# Patient Record
Sex: Female | Born: 1965 | Race: Black or African American | Hispanic: No | Marital: Single | Smoking: Former smoker
Health system: Southern US, Community
[De-identification: ages and names within clinical notes are randomized; demographics above are authoritative.]

## PROBLEM LIST (undated history)

## (undated) DIAGNOSIS — G4733 Obstructive sleep apnea (adult) (pediatric): Secondary | ICD-10-CM

## (undated) DIAGNOSIS — G40409 Other generalized epilepsy and epileptic syndromes, not intractable, without status epilepticus: Secondary | ICD-10-CM

## (undated) DIAGNOSIS — I509 Heart failure, unspecified: Secondary | ICD-10-CM

## (undated) DIAGNOSIS — D649 Anemia, unspecified: Secondary | ICD-10-CM

## (undated) DIAGNOSIS — I10 Essential (primary) hypertension: Secondary | ICD-10-CM

## (undated) DIAGNOSIS — Z8711 Personal history of peptic ulcer disease: Secondary | ICD-10-CM

## (undated) DIAGNOSIS — Z992 Dependence on renal dialysis: Secondary | ICD-10-CM

## (undated) DIAGNOSIS — J189 Pneumonia, unspecified organism: Secondary | ICD-10-CM

## (undated) DIAGNOSIS — Z8719 Personal history of other diseases of the digestive system: Secondary | ICD-10-CM

## (undated) DIAGNOSIS — K219 Gastro-esophageal reflux disease without esophagitis: Secondary | ICD-10-CM

## (undated) DIAGNOSIS — N186 End stage renal disease: Secondary | ICD-10-CM

## (undated) DIAGNOSIS — Z9989 Dependence on other enabling machines and devices: Secondary | ICD-10-CM

## (undated) DIAGNOSIS — J45909 Unspecified asthma, uncomplicated: Secondary | ICD-10-CM

## (undated) HISTORY — PX: AXILLARY VEIN - INTERNAL JUGULAR BYPASS GRAFT: SUR172

## (undated) HISTORY — PX: AV FISTULA PLACEMENT: SHX1204

---

## 1998-02-01 ENCOUNTER — Inpatient Hospital Stay (HOSPITAL_COMMUNITY): Admission: EM | Admit: 1998-02-01 | Discharge: 1998-02-01 | Payer: Self-pay | Admitting: *Deleted

## 1998-02-09 ENCOUNTER — Encounter: Admission: RE | Admit: 1998-02-09 | Discharge: 1998-02-09 | Payer: Self-pay | Admitting: Internal Medicine

## 1998-02-16 ENCOUNTER — Encounter: Admission: RE | Admit: 1998-02-16 | Discharge: 1998-02-16 | Payer: Self-pay | Admitting: Internal Medicine

## 1998-03-28 ENCOUNTER — Encounter: Admission: RE | Admit: 1998-03-28 | Discharge: 1998-03-28 | Payer: Self-pay | Admitting: Internal Medicine

## 1998-04-05 ENCOUNTER — Emergency Department (HOSPITAL_COMMUNITY): Admission: EM | Admit: 1998-04-05 | Discharge: 1998-04-05 | Payer: Self-pay | Admitting: Emergency Medicine

## 1998-04-20 ENCOUNTER — Emergency Department (HOSPITAL_COMMUNITY): Admission: EM | Admit: 1998-04-20 | Discharge: 1998-04-20 | Payer: Self-pay | Admitting: Internal Medicine

## 1998-07-12 ENCOUNTER — Encounter: Admission: RE | Admit: 1998-07-12 | Discharge: 1998-07-12 | Payer: Self-pay | Admitting: Hematology and Oncology

## 1998-07-12 ENCOUNTER — Ambulatory Visit (HOSPITAL_COMMUNITY): Admission: RE | Admit: 1998-07-12 | Discharge: 1998-07-12 | Payer: Self-pay | Admitting: Hematology and Oncology

## 1999-02-16 ENCOUNTER — Emergency Department (HOSPITAL_COMMUNITY): Admission: EM | Admit: 1999-02-16 | Discharge: 1999-02-16 | Payer: Self-pay | Admitting: Emergency Medicine

## 1999-03-24 ENCOUNTER — Encounter: Admission: RE | Admit: 1999-03-24 | Discharge: 1999-03-24 | Payer: Self-pay | Admitting: Internal Medicine

## 1999-05-10 ENCOUNTER — Encounter: Admission: RE | Admit: 1999-05-10 | Discharge: 1999-05-10 | Payer: Self-pay | Admitting: Hematology and Oncology

## 1999-05-10 ENCOUNTER — Ambulatory Visit (HOSPITAL_COMMUNITY): Admission: RE | Admit: 1999-05-10 | Discharge: 1999-05-10 | Payer: Self-pay | Admitting: Hematology and Oncology

## 1999-06-09 ENCOUNTER — Emergency Department (HOSPITAL_COMMUNITY): Admission: EM | Admit: 1999-06-09 | Discharge: 1999-06-09 | Payer: Self-pay | Admitting: Emergency Medicine

## 1999-06-09 ENCOUNTER — Encounter: Payer: Self-pay | Admitting: Emergency Medicine

## 1999-07-08 ENCOUNTER — Ambulatory Visit: Admission: RE | Admit: 1999-07-08 | Discharge: 1999-07-08 | Payer: Self-pay | Admitting: *Deleted

## 1999-07-21 ENCOUNTER — Encounter: Admission: RE | Admit: 1999-07-21 | Discharge: 1999-07-21 | Payer: Self-pay | Admitting: Internal Medicine

## 2000-02-29 ENCOUNTER — Encounter: Admission: RE | Admit: 2000-02-29 | Discharge: 2000-02-29 | Payer: Self-pay | Admitting: Internal Medicine

## 2000-08-19 ENCOUNTER — Emergency Department (HOSPITAL_COMMUNITY): Admission: EM | Admit: 2000-08-19 | Discharge: 2000-08-20 | Payer: Self-pay | Admitting: Emergency Medicine

## 2000-08-19 ENCOUNTER — Encounter: Payer: Self-pay | Admitting: Emergency Medicine

## 2000-08-21 ENCOUNTER — Encounter: Admission: RE | Admit: 2000-08-21 | Discharge: 2000-08-21 | Payer: Self-pay | Admitting: Hematology and Oncology

## 2000-09-04 ENCOUNTER — Encounter: Admission: RE | Admit: 2000-09-04 | Discharge: 2000-09-04 | Payer: Self-pay | Admitting: Internal Medicine

## 2001-07-14 ENCOUNTER — Encounter: Admission: RE | Admit: 2001-07-14 | Discharge: 2001-07-14 | Payer: Self-pay | Admitting: Internal Medicine

## 2001-07-24 ENCOUNTER — Emergency Department (HOSPITAL_COMMUNITY): Admission: EM | Admit: 2001-07-24 | Discharge: 2001-07-24 | Payer: Self-pay | Admitting: Emergency Medicine

## 2001-07-28 ENCOUNTER — Encounter: Admission: RE | Admit: 2001-07-28 | Discharge: 2001-07-28 | Payer: Self-pay

## 2001-08-10 ENCOUNTER — Ambulatory Visit (HOSPITAL_BASED_OUTPATIENT_CLINIC_OR_DEPARTMENT_OTHER): Admission: RE | Admit: 2001-08-10 | Discharge: 2001-08-10 | Payer: Self-pay | Admitting: Internal Medicine

## 2001-08-21 ENCOUNTER — Encounter: Admission: RE | Admit: 2001-08-21 | Discharge: 2001-08-21 | Payer: Self-pay | Admitting: Internal Medicine

## 2001-11-05 ENCOUNTER — Encounter: Admission: RE | Admit: 2001-11-05 | Discharge: 2001-11-05 | Payer: Self-pay | Admitting: Internal Medicine

## 2001-11-26 ENCOUNTER — Encounter: Admission: RE | Admit: 2001-11-26 | Discharge: 2001-11-26 | Payer: Self-pay | Admitting: Internal Medicine

## 2002-02-22 ENCOUNTER — Emergency Department (HOSPITAL_COMMUNITY): Admission: EM | Admit: 2002-02-22 | Discharge: 2002-02-22 | Payer: Self-pay

## 2002-02-25 ENCOUNTER — Inpatient Hospital Stay (HOSPITAL_COMMUNITY): Admission: AD | Admit: 2002-02-25 | Discharge: 2002-02-27 | Payer: Self-pay | Admitting: Internal Medicine

## 2002-02-25 ENCOUNTER — Encounter: Payer: Self-pay | Admitting: Internal Medicine

## 2002-02-25 ENCOUNTER — Encounter: Admission: RE | Admit: 2002-02-25 | Discharge: 2002-02-25 | Payer: Self-pay | Admitting: Internal Medicine

## 2002-02-26 ENCOUNTER — Encounter: Payer: Self-pay | Admitting: Internal Medicine

## 2002-02-27 ENCOUNTER — Encounter: Payer: Self-pay | Admitting: Internal Medicine

## 2002-03-13 ENCOUNTER — Encounter: Admission: RE | Admit: 2002-03-13 | Discharge: 2002-03-13 | Payer: Self-pay | Admitting: Internal Medicine

## 2002-03-16 ENCOUNTER — Encounter: Admission: RE | Admit: 2002-03-16 | Discharge: 2002-03-16 | Payer: Self-pay | Admitting: Internal Medicine

## 2002-03-27 ENCOUNTER — Encounter: Admission: RE | Admit: 2002-03-27 | Discharge: 2002-03-27 | Payer: Self-pay | Admitting: Internal Medicine

## 2002-06-29 ENCOUNTER — Emergency Department (HOSPITAL_COMMUNITY): Admission: EM | Admit: 2002-06-29 | Discharge: 2002-06-30 | Payer: Self-pay | Admitting: *Deleted

## 2002-08-24 ENCOUNTER — Encounter: Payer: Self-pay | Admitting: Emergency Medicine

## 2002-08-24 ENCOUNTER — Emergency Department (HOSPITAL_COMMUNITY): Admission: EM | Admit: 2002-08-24 | Discharge: 2002-08-24 | Payer: Self-pay | Admitting: *Deleted

## 2002-12-23 ENCOUNTER — Emergency Department (HOSPITAL_COMMUNITY): Admission: EM | Admit: 2002-12-23 | Discharge: 2002-12-23 | Payer: Self-pay | Admitting: Emergency Medicine

## 2016-10-06 ENCOUNTER — Emergency Department (HOSPITAL_COMMUNITY)
Admission: EM | Admit: 2016-10-06 | Discharge: 2016-10-06 | Disposition: A | Discharge status: Home / Self Care | Payer: Medicare Other | Attending: Emergency Medicine | Admitting: Emergency Medicine

## 2016-10-06 ENCOUNTER — Emergency Department (HOSPITAL_COMMUNITY): Payer: Medicare Other

## 2016-10-06 ENCOUNTER — Encounter (HOSPITAL_COMMUNITY): Payer: Self-pay | Admitting: Emergency Medicine

## 2016-10-06 DIAGNOSIS — R0602 Shortness of breath: Secondary | ICD-10-CM | POA: Insufficient documentation

## 2016-10-06 DIAGNOSIS — N186 End stage renal disease: Secondary | ICD-10-CM | POA: Insufficient documentation

## 2016-10-06 DIAGNOSIS — Z4931 Encounter for adequacy testing for hemodialysis: Secondary | ICD-10-CM | POA: Diagnosis present

## 2016-10-06 DIAGNOSIS — Z992 Dependence on renal dialysis: Secondary | ICD-10-CM | POA: Diagnosis not present

## 2016-10-06 HISTORY — DX: Unspecified asthma, uncomplicated: J45.909

## 2016-10-06 HISTORY — DX: Essential (primary) hypertension: I10

## 2016-10-06 LAB — CBC
HCT: 30.8 % — ABNORMAL LOW (ref 36.0–46.0)
Hemoglobin: 9.6 g/dL — ABNORMAL LOW (ref 12.0–15.0)
MCH: 27 pg (ref 26.0–34.0)
MCHC: 31.2 g/dL (ref 30.0–36.0)
MCV: 86.8 fL (ref 78.0–100.0)
Platelets: 188 10*3/uL (ref 150–400)
RBC: 3.55 MIL/uL — ABNORMAL LOW (ref 3.87–5.11)
RDW: 15.9 % — ABNORMAL HIGH (ref 11.5–15.5)
WBC: 7.9 10*3/uL (ref 4.0–10.5)

## 2016-10-06 LAB — BASIC METABOLIC PANEL
Anion gap: 15 (ref 5–15)
BUN: 63 mg/dL — ABNORMAL HIGH (ref 6–20)
CO2: 23 mmol/L (ref 22–32)
Calcium: 8.9 mg/dL (ref 8.9–10.3)
Chloride: 100 mmol/L — ABNORMAL LOW (ref 101–111)
Creatinine, Ser: 11.56 mg/dL — ABNORMAL HIGH (ref 0.44–1.00)
GFR calc Af Amer: 4 mL/min — ABNORMAL LOW (ref >60)
GFR calc non Af Amer: 3 mL/min — ABNORMAL LOW (ref >60)
Glucose, Bld: 81 mg/dL (ref 65–99)
Potassium: 4.6 mmol/L (ref 3.5–5.1)
Sodium: 138 mmol/L (ref 135–145)

## 2016-10-06 NOTE — Discharge Instructions (Signed)
Please read and follow all provided instructions.  Your diagnoses today include:  1. Shortness of breath     Tests performed today include: Vital signs. See below for your results today.   Medications prescribed:  Take as prescribed   Home care instructions:  Follow any educational materials contained in this packet.  Follow-up instructions: Please Call Dialysis Center on Monday morning at 6am  Return instructions:  Please return to the Emergency Department if you do not get better, if you get worse, or new symptoms OR  - Fever (temperature greater than 101.64F)  - Bleeding that does not stop with holding pressure to the area    -Severe pain (please note that you may be more sore the day after your accident)  - Chest Pain  - Difficulty breathing  - Severe nausea or vomiting  - Inability to tolerate food and liquids  - Passing out  - Skin becoming red around your wounds  - Change in mental status (confusion or lethargy)  - New numbness or weakness    Please return if you have any other emergent concerns.  Additional Information:  Your vital signs today were: BP 147/89 (BP Location: Left Wrist)    Pulse 82    Temp 98 F (36.7 C) (Oral)    Resp 20    Ht 5\' 3"  (1.6 m)    Wt 136.1 kg    LMP  (LMP Unknown)    SpO2 100%    BMI 53.14 kg/m  If your blood pressure (BP) was elevated above 135/85 this visit, please have this repeated by your doctor within one month. ---------------

## 2016-10-06 NOTE — ED Notes (Signed)
Pt states her train was late today and she missed dialysis. Pt is visiting her sister and is originally from [place]. Pt states she is a Tu, Th, S dialysis pt. Pt states she never misses her dialysis appts and would've gone today but like she stated ealier she missed her train. Pt does not want any labs done or really to be evaluated, just wants dialysis.

## 2016-10-06 NOTE — ED Notes (Signed)
Pt refused blood draw,   Nurse aware. 

## 2016-10-06 NOTE — ED Triage Notes (Signed)
[facility] states the pt called 9-1-1 for dialysis. EMS advised [facility] was onscene initially and called out for ALS due to SOB and wheezing. EMS administered 1 breathing treatment for wheezing.   Pt states her train was late today and she missed dialysis. Pt is visiting her sister and is originally from [place]. Pt states she is a Tu, Th, S dialysis pt. Pt states she never misses her dialysis appts and would've gone today but like she stated ealier she missed her train. Pt does not want any labs done or really to be evaluated, just wants dialysis.

## 2016-10-06 NOTE — ED Provider Notes (Signed)
[facility] Provider Note   CSN: [id number] Arrival date & time: 10/06/16  2013  History   Chief Complaint Chief Complaint  Patient presents with  . Needs Dialysis    HPI Brittany Brock is a 51 y.o. female.  HPI  51 y.o. female with a hx of ESRD on Dialysis Tues, Thurs, Sat, presents to the Emergency Department today due to missing dialysis appointment. Pt states he is visiting sister from [place] and schedule dialysis appointments for 7 session in [place]. Missed appointment today at 11:30AM due to transportation issues. Pt just wants dialysis. No CP/ABD pain. Noted mild SOB, but states it's probably due to fluid. No N/V. No fevers. No pain currently. No other symptoms noted   No past medical history on file.  There are no active problems to display for this patient.   No past surgical history on file.  OB History    No data available       Home Medications    Prior to Admission medications   Not on File    Family History No family history on file.  Social History Social History  Substance Use Topics  . Smoking status: Not on file  . Smokeless tobacco: Not on file  . Alcohol use Not on file     Allergies   Patient has no allergy information on record.   Review of Systems Review of Systems ROS reviewed and all are negative for acute change except as noted in the HPI.  Physical Exam Updated Vital Signs There were no vitals taken for this visit.  Physical Exam  Constitutional: She is oriented to person, place, and time. Vital signs are normal. She appears well-developed and well-nourished.  HENT:  Head: Normocephalic and atraumatic.  Right Ear: Hearing normal.  Left Ear: Hearing normal.  Eyes: Conjunctivae and EOM are normal. Pupils are equal, round, and reactive to light.  Neck: Normal range of motion. Neck supple.  Cardiovascular: Normal rate, regular rhythm, normal heart sounds and intact distal pulses.   Pulmonary/Chest: Effort  normal and breath sounds normal.  Abdominal: Soft. There is no tenderness.  Musculoskeletal: Normal range of motion.  Right AV Fistula noted. Good Thrill  Neurological: She is alert and oriented to person, place, and time.  Skin: Skin is warm and dry.  Psychiatric: She has a normal mood and affect. Her speech is normal and behavior is normal. Thought content normal.  Nursing note and vitals reviewed.  ED Treatments / Results  Labs (all labs ordered are listed, but only abnormal results are displayed) Labs Reviewed  CBC - Abnormal; Notable for the following:       Result Value   RBC 3.55 (*)    Hemoglobin 9.6 (*)    HCT 30.8 (*)    RDW 15.9 (*)    All other components within normal limits  BASIC METABOLIC PANEL - Abnormal; Notable for the following:    Chloride 100 (*)    BUN 63 (*)    Creatinine, Ser 11.56 (*)    GFR calc non Af Amer 3 (*)    GFR calc Af Amer 4 (*)    All other components within normal limits    EKG  EKG Interpretation None       Radiology Dg Chest 2 View  Result Date: 10/06/2016 CLINICAL DATA:  Shortness of breath.  Renal failure EXAM: CHEST  2 VIEW COMPARISON:  None. FINDINGS: There is no edema or consolidation. Heart is mildly enlarged with mild pulmonary  venous hypertension. No adenopathy. 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 Several bones appear somewhat sclerotic, likely due to chronic renal failure. IMPRESSION: Pulmonary vascular congestion without frank edema or consolidation. Bony changes consistent with chronic renal failure/secondary hyperparathyroidism. Electronically Signed   By: Bretta BangWilliam  Woodruff III M.D.   On: 10/06/2016 21:30    Procedures Procedures (including critical care time)  Medications Ordered in ED Medications - No data to display   Initial Impression / Assessment and Plan / ED Course  I have reviewed the triage vital signs and the nursing  notes.  Pertinent labs & imaging results that were available during my care of the patient were reviewed by me and considered in my medical decision making (see chart for details).  Clinical Course     Final Clinical Impressions(s) / ED Diagnoses  {I have reviewed and evaluated the relevant laboratory values.   {I have reviewed the relevant previous healthcare records.  {I obtained HPI from historian. {Patient discussed with supervising physician.  ED Course:  Assessment: Pt is a 50yF with hx ESRD on Dialysis T,R,Sat who presents requesting Dialysis. Missed appointment today. Here visiting from [place]. No CP. No fevers. Mild SOB earlier that has resolved.  On exam, pt in NAD. Nontoxic/nonseptic appearing. VSS. Afebrile. Lungs CTA. Heart RRR. Abdomen nontender soft. CBC unremarkable. BMP unremarkable with Potassium WNL.  CXR unremarkable. Dialysis closed tomorrow and pt at risk of worsening condition due to missed dialysis. Consulted Nephrology. Have pt call Dialysis center on Monday morning. Plan is to DC home with follow up to PCP. At time of discharge, Patient is in no acute distress. Vital Signs are stable. Patient is able to ambulate. Patient able to tolerate PO.   Disposition/Plan:  DC Home Additional Verbal discharge instructions given and discussed with patient.  Pt Instructed to f/u with PCP in the next week for evaluation and treatment of symptoms. Return precautions given Pt acknowledges and agrees with plan  Supervising Physician Donnetta HutchingBrian Cook, MD  Final diagnoses:  Shortness of breath    New Prescriptions New Prescriptions   No medications on file     Audry Piliyler Brezlyn Manrique, PA-C 10/06/16 2203    Donnetta HutchingBrian Cook, MD 10/07/16 [id number]

## 2016-10-17 ENCOUNTER — Emergency Department (HOSPITAL_COMMUNITY)
Admission: EM | Admit: 2016-10-17 | Discharge: 2016-10-17 | Disposition: A | Discharge status: Home / Self Care | Payer: Medicare Other | Attending: Emergency Medicine | Admitting: Emergency Medicine

## 2016-10-17 ENCOUNTER — Emergency Department (HOSPITAL_COMMUNITY): Payer: Medicare Other

## 2016-10-17 ENCOUNTER — Encounter (HOSPITAL_COMMUNITY): Payer: Self-pay | Admitting: *Deleted

## 2016-10-17 DIAGNOSIS — R0602 Shortness of breath: Secondary | ICD-10-CM | POA: Diagnosis present

## 2016-10-17 DIAGNOSIS — J069 Acute upper respiratory infection, unspecified: Secondary | ICD-10-CM | POA: Insufficient documentation

## 2016-10-17 DIAGNOSIS — I120 Hypertensive chronic kidney disease with stage 5 chronic kidney disease or end stage renal disease: Secondary | ICD-10-CM | POA: Diagnosis not present

## 2016-10-17 DIAGNOSIS — Z79899 Other long term (current) drug therapy: Secondary | ICD-10-CM | POA: Insufficient documentation

## 2016-10-17 DIAGNOSIS — Z992 Dependence on renal dialysis: Secondary | ICD-10-CM | POA: Diagnosis not present

## 2016-10-17 DIAGNOSIS — B9789 Other viral agents as the cause of diseases classified elsewhere: Secondary | ICD-10-CM

## 2016-10-17 DIAGNOSIS — Z87891 Personal history of nicotine dependence: Secondary | ICD-10-CM | POA: Diagnosis not present

## 2016-10-17 DIAGNOSIS — N186 End stage renal disease: Secondary | ICD-10-CM | POA: Insufficient documentation

## 2016-10-17 DIAGNOSIS — J45909 Unspecified asthma, uncomplicated: Secondary | ICD-10-CM | POA: Diagnosis not present

## 2016-10-17 LAB — CBC WITH DIFFERENTIAL/PLATELET
BASOS ABS: 0 10*3/uL (ref 0.0–0.1)
BASOS PCT: 0 %
Eosinophils Absolute: 0.3 10*3/uL (ref 0.0–0.7)
Eosinophils Relative: 4 %
HEMATOCRIT: 30 % — AB (ref 36.0–46.0)
HEMOGLOBIN: 9.2 g/dL — AB (ref 12.0–15.0)
LYMPHS PCT: 26 %
Lymphs Abs: 1.9 10*3/uL (ref 0.7–4.0)
MCH: 26.9 pg (ref 26.0–34.0)
MCHC: 30.7 g/dL (ref 30.0–36.0)
MCV: 87.7 fL (ref 78.0–100.0)
MONO ABS: 0.6 10*3/uL (ref 0.1–1.0)
Monocytes Relative: 9 %
NEUTROS ABS: 4.5 10*3/uL (ref 1.7–7.7)
NEUTROS PCT: 61 %
Platelets: 196 10*3/uL (ref 150–400)
RBC: 3.42 MIL/uL — ABNORMAL LOW (ref 3.87–5.11)
RDW: 16 % — AB (ref 11.5–15.5)
WBC: 7.3 10*3/uL (ref 4.0–10.5)

## 2016-10-17 LAB — BASIC METABOLIC PANEL
ANION GAP: 13 (ref 5–15)
BUN: 19 mg/dL (ref 6–20)
CALCIUM: 9.2 mg/dL (ref 8.9–10.3)
CHLORIDE: 96 mmol/L — AB (ref 101–111)
CO2: 27 mmol/L (ref 22–32)
Creatinine, Ser: 8.15 mg/dL — ABNORMAL HIGH (ref 0.44–1.00)
GFR calc non Af Amer: 5 mL/min — ABNORMAL LOW (ref >60)
GFR, EST AFRICAN AMERICAN: 6 mL/min — AB (ref >60)
GLUCOSE: 156 mg/dL — AB (ref 65–99)
POTASSIUM: 4 mmol/L (ref 3.5–5.1)
Sodium: 136 mmol/L (ref 135–145)

## 2016-10-17 LAB — I-STAT TROPONIN, ED: Troponin i, poc: 0 ng/mL (ref 0.00–0.08)

## 2016-10-17 MED ORDER — OXYMETAZOLINE HCL 0.05 % NA SOLN
2.0000 | Freq: Once | NASAL | Status: AC
  Administered 2016-10-17: 2 via NASAL
  Filled 2016-10-17: qty 15

## 2016-10-17 MED ORDER — BENZONATATE 100 MG PO CAPS: 100.0000 mg | ORAL_CAPSULE | Freq: Three times a day (TID) | ORAL | 0 refills | Status: DC | PRN

## 2016-10-17 NOTE — ED Notes (Signed)
Pt states she understands instructions. Home stable via wc with family. 

## 2016-10-17 NOTE — ED Triage Notes (Signed)
Patient states she arrived in town on SAT and is here for approx. 1 month. States she had dialysis on Monday and Tues. States she hasn't felt well since she got her c/o sob and cough. States she had her full dialysis.

## 2016-10-17 NOTE — ED Provider Notes (Signed)
TIME SEEN: 5:50 AM  CHIEF COMPLAINT: Nasal congestion, cough  HPI: Pt is a 51 y.o. female with history of hypertension, end-stage renal disease on hemodialysis Tuesday, Thursday and Saturday he was last dialyzed yesterday who presents to the emergency department stating "I don't feel well". States that she has had dry cough, nasal congestion, body aches for the past week. Has had subjective fevers and chills. Reports that she has had intermittent yellow sputum production. Family reports she complained of chest pain with coughing and that is why they brought her to the hospital. No vomiting or diarrhea. Has not missed any recent dialysis. No history of ACS, PE or DVT. Does wear oxygen at night as she has sleep apnea. States she feel short of breath because she cannot breathe through her nose.  ROS: See HPI Constitutional:  fever  Eyes: no drainage  ENT:  runny nose   Cardiovascular:  no chest pain  Resp: no SOB  GI: no vomiting GU: no dysuria Integumentary: no rash  Allergy: no hives  Musculoskeletal: no leg swelling  Neurological: no slurred speech ROS otherwise negative  PAST MEDICAL HISTORY/PAST SURGICAL HISTORY:  Past Medical History:  Diagnosis Date  . Asthma   . Hypertension   . Renal disorder   . Sleep apnea     MEDICATIONS:  Prior to Admission medications   Medication Sig Start Date End Date Taking? Authorizing Provider  albuterol (PROVENTIL HFA;VENTOLIN HFA) 108 (90 Base) MCG/ACT inhaler Inhale 2 puffs into the lungs every 6 (six) hours as needed for wheezing or shortness of breath.   Yes Historical Provider, MD  calcium acetate (PHOSLO) 667 MG capsule Take 2,668 mg by mouth 3 (three) times daily with meals.   Yes Historical Provider, MD  cinacalcet (SENSIPAR) 90 MG tablet Take 180 mg by mouth every evening.    Yes Historical Provider, MD  cloNIDine (CATAPRES) 0.1 MG tablet Take 0.1 mg by mouth 2 (two) times daily.   Yes Historical Provider, MD  Fluticasone-Salmeterol  (ADVAIR) 250-50 MCG/DOSE AEPB Inhale 1 puff into the lungs 2 (two) times daily.   Yes Historical Provider, MD  isosorbide mononitrate (IMDUR) 30 MG 24 hr tablet Take 30 mg by mouth daily.   Yes Historical Provider, MD  losartan (COZAAR) 50 MG tablet Take 50 mg by mouth 2 (two) times daily.   Yes Historical Provider, MD  metoprolol (LOPRESSOR) 50 MG tablet Take 50 mg by mouth 2 (two) times daily.   Yes Historical Provider, MD  phenytoin (DILANTIN) 100 MG ER capsule Take by mouth 3 (three) times daily.   Yes Historical Provider, MD    ALLERGIES:  Allergies  Allergen Reactions  . Penicillins Hives    SOCIAL HISTORY:  Social History  Substance Use Topics  . Smoking status: Former Smoker    Packs/day: 5.00    Years: 30.00    Types: Cigarettes    Quit date: 09/30/2016  . Smokeless tobacco: Never Used  . Alcohol use No     Comment: Social    FAMILY HISTORY: History reviewed. No pertinent family history.  EXAM: BP 158/98 (BP Location: Left Arm)   Temp 98.7 F (37.1 C) (Oral)   Resp 22   Ht 5\' 3"  (1.6 m)   Wt 285 lb 4.4 oz (129.4 kg)   LMP  (LMP Unknown)   SpO2 100%   BMI 50.53 kg/m  CONSTITUTIONAL: Alert and oriented and responds appropriately to questions. Well-appearing; well-nourished HEAD: Normocephalic EYES: Conjunctivae clear, PERRL, EOMI ENT: normal nose; no  rhinorrhea; Nasal congestion, moist mucous membranes NECK: Supple, no meningismus, no nuchal rigidity, no LAD  CARD: RRR; S1 and S2 appreciated; no murmurs, no clicks, no rubs, no gallops RESP: Normal chest excursion without splinting or tachypnea; breath sounds clear and equal bilaterally; no wheezes, no rhonchi, no rales, no hypoxia or respiratory distress, speaking full sentences ABD/GI: Normal bowel sounds; non-distended; soft, non-tender, no rebound, no guarding, no peritoneal signs, no hepatosplenomegaly BACK:  The back appears normal and is non-tender to palpation, there is no CVA tenderness EXT: Normal ROM  in all joints; non-tender to palpation; no edema; normal capillary refill; no cyanosis, no calf tenderness or swelling    SKIN: Normal color for age and race; warm; no rash NEURO: Moves all extremities equally, sensation to light touch intact diffusely, cranial nerves II through XII intact, normal speech PSYCH: The patient's mood and manner are appropriate. Grooming and personal hygiene are appropriate.  MEDICAL DECISION MAKING: Patient here with what sounds like viral upper respiratory infection. Does complain of some chest pain that has resolved likely musculoskeletal as it seems to be worse with coughing. Given she is a dialysis patient however we will obtain labs including troponin, EKG and a chest x-ray. She does not appear volume overloaded currently. Has a lot of nasal congestion which I think is what is contributing to her feeling short of breath.    ED PROGRESS: Patient's labs show chronic kidney disease. Potassium normal. No leukocytosis. She has anemia which is likely secondary to her chronic kidney disease. Troponin is negative. Chest x-ray shows cardiomegaly with vascular congestion and possible mild pulmonary edema. No infiltrate. She is satting well on her normal oxygen. Her lungs are completely clear. I do not feel this is an indication for emergent dialysis and she is scheduled for dialysis tomorrow. Again I suspect that this is a viral upper respiratory infection and most of her shortness of breath is because she feels she cannot breathe through her nose. We have provided her with Afrin and Tessalon Perles. Have recommended Mucinex over-the-counter and Tylenol at home for fever and pain. Discussed return precautions. She verbalizes understanding is comfortable with this plan.   At this time, I do not feel there is any life-threatening condition present. I have reviewed and discussed all results (EKG, imaging, lab, urine as appropriate) and exam findings with patient/family. I have  reviewed nursing notes and appropriate previous records.  I feel the patient is safe to be discharged home without further emergent workup and can continue workup as an outpatient as needed. Discussed usual and customary return precautions. Patient/family verbalize understanding and are comfortable with this plan.  Outpatient follow-up has been provided. All questions have been answered.   EKG Interpretation  Date/Time:  Wednesday October 17 2016 06:52:32 EST Ventricular Rate:  82 PR Interval:    QRS Duration: 109 QT Interval:  405 QTC Calculation: 473 R Axis:   11 Text Interpretation:  Sinus rhythm Low voltage, precordial leads No old tracing to compare Confirmed by WARD,  DO, KRISTEN [phone]([id number]) on 10/17/2016 7:14:14 AM         Layla MawKristen N Ward, DO 10/17/16 [id number]

## 2016-10-24 ENCOUNTER — Encounter (HOSPITAL_COMMUNITY): Payer: Self-pay | Admitting: Emergency Medicine

## 2016-10-24 ENCOUNTER — Emergency Department (HOSPITAL_COMMUNITY): Payer: Medicare Other

## 2016-10-24 ENCOUNTER — Emergency Department (HOSPITAL_COMMUNITY)
Admission: EM | Admit: 2016-10-24 | Discharge: 2016-10-24 | Disposition: A | Discharge status: Home / Self Care | Payer: Medicare Other | Attending: Emergency Medicine | Admitting: Emergency Medicine

## 2016-10-24 DIAGNOSIS — B9789 Other viral agents as the cause of diseases classified elsewhere: Secondary | ICD-10-CM

## 2016-10-24 DIAGNOSIS — Z87891 Personal history of nicotine dependence: Secondary | ICD-10-CM | POA: Diagnosis not present

## 2016-10-24 DIAGNOSIS — I10 Essential (primary) hypertension: Secondary | ICD-10-CM | POA: Insufficient documentation

## 2016-10-24 DIAGNOSIS — J988 Other specified respiratory disorders: Secondary | ICD-10-CM | POA: Insufficient documentation

## 2016-10-24 DIAGNOSIS — J45909 Unspecified asthma, uncomplicated: Secondary | ICD-10-CM | POA: Diagnosis not present

## 2016-10-24 DIAGNOSIS — R05 Cough: Secondary | ICD-10-CM | POA: Diagnosis present

## 2016-10-24 DIAGNOSIS — Z79899 Other long term (current) drug therapy: Secondary | ICD-10-CM | POA: Insufficient documentation

## 2016-10-24 LAB — CBC WITH DIFFERENTIAL/PLATELET
Basophils Absolute: 0 10*3/uL (ref 0.0–0.1)
Basophils Relative: 0 %
EOS ABS: 0.3 10*3/uL (ref 0.0–0.7)
EOS PCT: 3 %
HCT: 32.4 % — ABNORMAL LOW (ref 36.0–46.0)
Hemoglobin: 9.9 g/dL — ABNORMAL LOW (ref 12.0–15.0)
LYMPHS ABS: 1.7 10*3/uL (ref 0.7–4.0)
LYMPHS PCT: 20 %
MCH: 26.8 pg (ref 26.0–34.0)
MCHC: 30.6 g/dL (ref 30.0–36.0)
MCV: 87.6 fL (ref 78.0–100.0)
MONO ABS: 0.7 10*3/uL (ref 0.1–1.0)
MONOS PCT: 8 %
Neutro Abs: 5.7 10*3/uL (ref 1.7–7.7)
Neutrophils Relative %: 69 %
PLATELETS: 197 10*3/uL (ref 150–400)
RBC: 3.7 MIL/uL — ABNORMAL LOW (ref 3.87–5.11)
RDW: 16 % — ABNORMAL HIGH (ref 11.5–15.5)
WBC: 8.4 10*3/uL (ref 4.0–10.5)

## 2016-10-24 LAB — BASIC METABOLIC PANEL
Anion gap: 16 — ABNORMAL HIGH (ref 5–15)
BUN: 17 mg/dL (ref 6–20)
CO2: 28 mmol/L (ref 22–32)
CREATININE: 7.19 mg/dL — AB (ref 0.44–1.00)
Calcium: 9.4 mg/dL (ref 8.9–10.3)
Chloride: 94 mmol/L — ABNORMAL LOW (ref 101–111)
GFR calc Af Amer: 7 mL/min — ABNORMAL LOW (ref >60)
GFR, EST NON AFRICAN AMERICAN: 6 mL/min — AB (ref >60)
GLUCOSE: 103 mg/dL — AB (ref 65–99)
Potassium: 3.7 mmol/L (ref 3.5–5.1)
SODIUM: 138 mmol/L (ref 135–145)

## 2016-10-24 LAB — I-STAT TROPONIN, ED: Troponin i, poc: 0.01 ng/mL (ref 0.00–0.08)

## 2016-10-24 MED ORDER — ALBUTEROL SULFATE (2.5 MG/3ML) 0.083% IN NEBU
5.0000 mg | INHALATION_SOLUTION | Freq: Once | RESPIRATORY_TRACT | Status: AC
  Administered 2016-10-24: 5 mg via RESPIRATORY_TRACT

## 2016-10-24 MED ORDER — AZITHROMYCIN 250 MG PO TABS: ORAL_TABLET | ORAL | 0 refills | Status: DC

## 2016-10-24 MED ORDER — ALBUTEROL SULFATE (2.5 MG/3ML) 0.083% IN NEBU
INHALATION_SOLUTION | RESPIRATORY_TRACT | Status: AC
  Filled 2016-10-24: qty 3

## 2016-10-24 MED ORDER — BENZONATATE 100 MG PO CAPS: 100.0000 mg | ORAL_CAPSULE | Freq: Three times a day (TID) | ORAL | 0 refills | Status: DC | PRN

## 2016-10-24 NOTE — Discharge Instructions (Signed)
Your symptoms are likely caused by a viral respiratory infection.  However, due to the prolonged course of your sickness, please take antibiotic Zpak as prescribed to treat for potential pneumonia.  Take Tessalon as cough medication.  Continue with your dialysis schedule and follow up with your doctor for further care.

## 2016-10-24 NOTE — ED Provider Notes (Signed)
[facility] Provider Note   CSN: [id number] Arrival date & time: 10/24/16  0022     History   Chief Complaint Chief Complaint  Patient presents with  . Shortness of Breath  . Cough    HPI Brittany Brock is a 51 y.o. female.  HPI   51 year old female with history of end-stage renal disease currently on Tuesday Thursday Saturday dialysis, hypertension, asthma presenting with flulike symptoms. Patient states for more than 2 weeks she has had congestions, sneezing, coughing, pleuritic chest chest pain, generalized fatigue, subjective fever, chills, myalgias. Symptom has been persistent and this is her third ER visits for the same. She report receiving several different medications such as Mucinex, antibiotic with minimal relief. Patient denies lightheadedness, nausea, vomiting, diarrhea. Her last dialysis was yesterday. Patient is here requesting to be admitted to the hospital to get treated until the symptoms completely resolved.  Past Medical History:  Diagnosis Date  . Asthma   . Hypertension   . Renal disorder   . Sleep apnea     There are no active problems to display for this patient.   Past Surgical History:  Procedure Laterality Date  . AXILLARY VEIN - INTERNAL JUGULAR BYPASS GRAFT    . Graft      OB History    No data available       Home Medications    Prior to Admission medications   Medication Sig Start Date End Date Taking? Authorizing Provider  albuterol (PROVENTIL HFA;VENTOLIN HFA) 108 (90 Base) MCG/ACT inhaler Inhale 2 puffs into the lungs every 6 (six) hours as needed for wheezing or shortness of breath.   Yes Historical Provider, MD  benzonatate (TESSALON) 100 MG capsule Take 1 capsule (100 mg total) by mouth 3 (three) times daily as needed for cough. 10/17/16  Yes Kristen N Ward, DO  calcium acetate (PHOSLO) 667 MG capsule Take 2,668 mg by mouth 3 (three) times daily with meals.   Yes Historical Provider, MD  cinacalcet (SENSIPAR) 90 MG  tablet Take 180 mg by mouth every evening.    Yes Historical Provider, MD  cloNIDine (CATAPRES) 0.1 MG tablet Take 0.1 mg by mouth 2 (two) times daily.   Yes Historical Provider, MD  Fluticasone-Salmeterol (ADVAIR) 250-50 MCG/DOSE AEPB Inhale 1 puff into the lungs 2 (two) times daily.   Yes Historical Provider, MD  isosorbide mononitrate (IMDUR) 30 MG 24 hr tablet Take 30 mg by mouth daily.   Yes Historical Provider, MD  losartan (COZAAR) 50 MG tablet Take 50 mg by mouth 2 (two) times daily.   Yes Historical Provider, MD  metoprolol (LOPRESSOR) 50 MG tablet Take 50 mg by mouth 2 (two) times daily.   Yes Historical Provider, MD  phenytoin (DILANTIN) 100 MG ER capsule Take by mouth 3 (three) times daily.   Yes Historical Provider, MD    Family History No family history on file.  Social History Social History  Substance Use Topics  . Smoking status: Former Smoker    Packs/day: 5.00    Years: 30.00    Types: Cigarettes    Quit date: 09/30/2016  . Smokeless tobacco: Never Used  . Alcohol use No     Comment: Social     Allergies   Penicillins   Review of Systems Review of Systems  All other systems reviewed and are negative.    Physical Exam Updated Vital Signs BP 146/77 (BP Location: Left Arm)   Pulse 85   Temp 98.9 F (37.2 C) (Oral)  Resp 25   Ht 5\' 3"  (1.6 m)   Wt 129.3 kg   LMP  (LMP Unknown)   SpO2 98%   BMI 50.49 kg/m   Physical Exam  Constitutional: She is oriented to person, place, and time. She appears well-developed and well-nourished. No distress.  Moderately obese female sleeping in bed but easily arousable.  HENT:  Head: Atraumatic.  Right Ear: External ear normal.  Left Ear: External ear normal.  Nose: Nose normal.  Mouth/Throat: Oropharynx is clear and moist.  Wearing O2 supplementation  Eyes: Conjunctivae are normal.  Neck: Neck supple.  Cardiovascular: Normal rate and regular rhythm.   Pulmonary/Chest: Effort normal and breath sounds normal.  No stridor.  Decreased lung sounds without overt wheezes, rales, or rhonchi  Abdominal: Soft. There is no tenderness.  Musculoskeletal: She exhibits no edema.  Neurological: She is alert and oriented to person, place, and time.  Skin: No rash noted.  Psychiatric: She has a normal mood and affect.  Nursing note and vitals reviewed.    ED Treatments / Results  Labs (all labs ordered are listed, but only abnormal results are displayed) Labs Reviewed  CBC WITH DIFFERENTIAL/PLATELET - Abnormal; Notable for the following:       Result Value   RBC 3.70 (*)    Hemoglobin 9.9 (*)    HCT 32.4 (*)    RDW 16.0 (*)    All other components within normal limits  BASIC METABOLIC PANEL - Abnormal; Notable for the following:    Chloride 94 (*)    Glucose, Bld 103 (*)    Creatinine, Ser 7.19 (*)    GFR calc non Af Amer 6 (*)    GFR calc Af Amer 7 (*)    Anion gap 16 (*)    All other components within normal limits  I-STAT TROPOININ, ED    EKG  EKG Interpretation  Date/Time:  Wednesday October 24 2016 00:35:04 EST Ventricular Rate:  81 PR Interval:  142 QRS Duration: 90 QT Interval:  392 QTC Calculation: 455 R Axis:   -25 Text Interpretation:  Normal sinus rhythm Minimal voltage criteria for LVH, may be normal variant Borderline ECG When compared with ECG of 10/17/2016, No significant change was found Confirmed by [facility]  MD, DAVID ([id number]) on 10/24/2016 12:43:56 AM       Radiology Dg Chest 2 View  Result Date: 10/24/2016 CLINICAL DATA:  Dyspnea since Sunday EXAM: CHEST  2 VIEW COMPARISON:  10/17/2016 FINDINGS: Heart is enlarged. No aortic aneurysm. Mild interstitial edema is again noted. No effusion or pneumothorax. No suspicious osseous abnormalities. IMPRESSION: Cardiomegaly with mild interstitial pulmonary edema. Electronically Signed   By: Tollie Eth M.D.   On: 10/24/2016 01:21    Procedures Procedures (including critical care time)  Medications Ordered in ED Medications    albuterol (PROVENTIL) (2.5 MG/3ML) 0.083% nebulizer solution (not administered)  albuterol (PROVENTIL) (2.5 MG/3ML) 0.083% nebulizer solution 5 mg (5 mg Nebulization Given 10/24/16 0051)     Initial Impression / Assessment and Plan / ED Course  I have reviewed the triage vital signs and the nursing notes.  Pertinent labs & imaging results that were available during my care of the patient were reviewed by me and considered in my medical decision making (see chart for details).     BP 154/90   Pulse 80   Temp 98.9 F (37.2 C) (Oral)   Resp 18   Ht 5\' 3"  (1.6 m)   Wt 129.3 kg   LMP  (  LMP Unknown)   SpO2 100%   BMI 50.49 kg/m    Final Clinical Impressions(s) / ED Diagnoses   Final diagnoses:  Viral respiratory infection    New Prescriptions New Prescriptions   AZITHROMYCIN (ZITHROMAX Z-PAK) 250 MG TABLET    2 po day one, then 1 daily x 4 days   6:32 AM Patient with history of pulmonary impairment as well as end-stage renal disease here with flulike symptoms that has been ongoing for more than 2 weeks. Her symptoms consistence with a viral etiology. Her labs are at baseline, normal potassium level, chest x-ray without acute focal infiltrates. I have reviewed patient's chart and I have not seen any prescription for antibiotics or antiviral medication from recent ER visits. Given her comorbidity, and the duration of her symptoms, I will prescribe azithromycin antibiotic to cover for potential atypical infection. I encouraged patient to follow-up with primary care provider for further care.   Fayrene HelperBowie Reigan Tolliver, PA-C 10/24/16 [id number]    Dione Boozeavid Glick, MD 10/24/16 [phone]

## 2016-10-24 NOTE — ED Triage Notes (Signed)
Pt was seen last week for URI and sent home with PO Abx pt states she finished all treatment and didn't have no relieve, denies any fever or chills at this time. C/o 10/10 generalized body ache.

## 2016-10-24 NOTE — ED Notes (Signed)
Pt asleep and needs frequent stimulation to complete initial assessment/questionnaire

## 2016-10-24 NOTE — ED Notes (Signed)
Patient stated, "we don't do anything for her". She stated she didn't want to talk or listen to me. Patient refused discharge paperwork, collected stuff and walked out. RN attempted to explain d/c and patient refused.

## 2017-07-22 ENCOUNTER — Encounter (HOSPITAL_COMMUNITY): Payer: Self-pay

## 2017-07-22 ENCOUNTER — Emergency Department (HOSPITAL_COMMUNITY): Payer: Medicare Other

## 2017-07-22 DIAGNOSIS — Z992 Dependence on renal dialysis: Secondary | ICD-10-CM

## 2017-07-22 DIAGNOSIS — N186 End stage renal disease: Secondary | ICD-10-CM | POA: Insufficient documentation

## 2017-07-22 DIAGNOSIS — Z79899 Other long term (current) drug therapy: Secondary | ICD-10-CM

## 2017-07-22 DIAGNOSIS — I120 Hypertensive chronic kidney disease with stage 5 chronic kidney disease or end stage renal disease: Secondary | ICD-10-CM | POA: Insufficient documentation

## 2017-07-22 DIAGNOSIS — E8770 Fluid overload, unspecified: Secondary | ICD-10-CM | POA: Diagnosis not present

## 2017-07-22 DIAGNOSIS — R0602 Shortness of breath: Secondary | ICD-10-CM | POA: Diagnosis not present

## 2017-07-22 DIAGNOSIS — J45909 Unspecified asthma, uncomplicated: Secondary | ICD-10-CM | POA: Insufficient documentation

## 2017-07-22 DIAGNOSIS — J810 Acute pulmonary edema: Secondary | ICD-10-CM

## 2017-07-22 DIAGNOSIS — Z87891 Personal history of nicotine dependence: Secondary | ICD-10-CM | POA: Insufficient documentation

## 2017-07-22 MED ORDER — ALBUTEROL SULFATE (2.5 MG/3ML) 0.083% IN NEBU: 5.0000 mg | INHALATION_SOLUTION | Freq: Once | RESPIRATORY_TRACT | Status: DC

## 2017-07-22 NOTE — ED Triage Notes (Signed)
[facility] pt states she has dialysis T,TH, and Satuday and did not miss a day; Pt presents with loud labored breathing at triage; Pt refused neb tx at triage states she as had one; pt states she needs Oxygen; pt sats at triage is 100% RA; pt given O2 for comfort-Monique,RN

## 2017-07-23 ENCOUNTER — Inpatient Hospital Stay (HOSPITAL_COMMUNITY)
Admission: EM | Admit: 2017-07-23 | Discharge: 2017-07-26 | DRG: 640 | Disposition: A | Discharge status: Home / Self Care | Payer: Medicare Other | Attending: Nephrology | Admitting: Nephrology

## 2017-07-23 ENCOUNTER — Emergency Department (HOSPITAL_COMMUNITY)
Admission: EM | Admit: 2017-07-23 | Discharge: 2017-07-23 | Disposition: A | Discharge status: Home / Self Care | Payer: Medicare Other | Source: Home / Self Care | Attending: Emergency Medicine | Admitting: Emergency Medicine

## 2017-07-23 ENCOUNTER — Encounter (HOSPITAL_COMMUNITY): Payer: Self-pay | Admitting: Emergency Medicine

## 2017-07-23 DIAGNOSIS — J9601 Acute respiratory failure with hypoxia: Secondary | ICD-10-CM | POA: Diagnosis present

## 2017-07-23 DIAGNOSIS — Z7951 Long term (current) use of inhaled steroids: Secondary | ICD-10-CM

## 2017-07-23 DIAGNOSIS — Z79899 Other long term (current) drug therapy: Secondary | ICD-10-CM

## 2017-07-23 DIAGNOSIS — J810 Acute pulmonary edema: Secondary | ICD-10-CM | POA: Diagnosis not present

## 2017-07-23 DIAGNOSIS — I10 Essential (primary) hypertension: Secondary | ICD-10-CM

## 2017-07-23 DIAGNOSIS — Z9119 Patient's noncompliance with other medical treatment and regimen: Secondary | ICD-10-CM

## 2017-07-23 DIAGNOSIS — Z9115 Patient's noncompliance with renal dialysis: Secondary | ICD-10-CM

## 2017-07-23 DIAGNOSIS — R0602 Shortness of breath: Secondary | ICD-10-CM

## 2017-07-23 DIAGNOSIS — Z87891 Personal history of nicotine dependence: Secondary | ICD-10-CM

## 2017-07-23 DIAGNOSIS — I120 Hypertensive chronic kidney disease with stage 5 chronic kidney disease or end stage renal disease: Secondary | ICD-10-CM | POA: Diagnosis present

## 2017-07-23 DIAGNOSIS — J45909 Unspecified asthma, uncomplicated: Secondary | ICD-10-CM | POA: Diagnosis present

## 2017-07-23 DIAGNOSIS — N186 End stage renal disease: Secondary | ICD-10-CM | POA: Diagnosis not present

## 2017-07-23 DIAGNOSIS — Z992 Dependence on renal dialysis: Secondary | ICD-10-CM

## 2017-07-23 DIAGNOSIS — E8770 Fluid overload, unspecified: Principal | ICD-10-CM | POA: Diagnosis present

## 2017-07-23 DIAGNOSIS — D631 Anemia in chronic kidney disease: Secondary | ICD-10-CM | POA: Diagnosis present

## 2017-07-23 DIAGNOSIS — Z609 Problem related to social environment, unspecified: Secondary | ICD-10-CM | POA: Diagnosis present

## 2017-07-23 DIAGNOSIS — I150 Renovascular hypertension: Secondary | ICD-10-CM

## 2017-07-23 DIAGNOSIS — I160 Hypertensive urgency: Secondary | ICD-10-CM | POA: Diagnosis present

## 2017-07-23 DIAGNOSIS — G4730 Sleep apnea, unspecified: Secondary | ICD-10-CM | POA: Diagnosis present

## 2017-07-23 DIAGNOSIS — Z880 Allergy status to penicillin: Secondary | ICD-10-CM

## 2017-07-23 DIAGNOSIS — E8889 Other specified metabolic disorders: Secondary | ICD-10-CM | POA: Diagnosis present

## 2017-07-23 DIAGNOSIS — Z6841 Body Mass Index (BMI) 40.0 and over, adult: Secondary | ICD-10-CM

## 2017-07-23 HISTORY — DX: Personal history of other diseases of the digestive system: Z87.19

## 2017-07-23 HISTORY — DX: End stage renal disease: N18.6

## 2017-07-23 HISTORY — DX: Dependence on renal dialysis: Z99.2

## 2017-07-23 HISTORY — DX: Other generalized epilepsy and epileptic syndromes, not intractable, without status epilepticus: G40.409

## 2017-07-23 HISTORY — DX: Gastro-esophageal reflux disease without esophagitis: K21.9

## 2017-07-23 HISTORY — DX: Dependence on other enabling machines and devices: Z99.89

## 2017-07-23 HISTORY — DX: Personal history of peptic ulcer disease: Z87.11

## 2017-07-23 HISTORY — DX: Pneumonia, unspecified organism: J18.9

## 2017-07-23 HISTORY — DX: Anemia, unspecified: D64.9

## 2017-07-23 HISTORY — DX: Obstructive sleep apnea (adult) (pediatric): G47.33

## 2017-07-23 HISTORY — DX: Heart failure, unspecified: I50.9

## 2017-07-23 LAB — RENAL FUNCTION PANEL
ANION GAP: 13 (ref 5–15)
Albumin: 3.3 g/dL — ABNORMAL LOW (ref 3.5–5.0)
BUN: 50 mg/dL — ABNORMAL HIGH (ref 6–20)
CHLORIDE: 96 mmol/L — AB (ref 101–111)
CO2: 27 mmol/L (ref 22–32)
CREATININE: 13.52 mg/dL — AB (ref 0.44–1.00)
Calcium: 9.2 mg/dL (ref 8.9–10.3)
GFR calc Af Amer: 3 mL/min — ABNORMAL LOW (ref >60)
GFR calc non Af Amer: 3 mL/min — ABNORMAL LOW (ref >60)
Glucose, Bld: 108 mg/dL — ABNORMAL HIGH (ref 65–99)
POTASSIUM: 4.6 mmol/L (ref 3.5–5.1)
Phosphorus: 7.5 mg/dL — ABNORMAL HIGH (ref 2.5–4.6)
Sodium: 136 mmol/L (ref 135–145)

## 2017-07-23 LAB — I-STAT CHEM 8, ED
BUN: 45 mg/dL — AB (ref 6–20)
CALCIUM ION: 1.13 mmol/L — AB (ref 1.15–1.40)
Chloride: 99 mmol/L — ABNORMAL LOW (ref 101–111)
Creatinine, Ser: 12.5 mg/dL — ABNORMAL HIGH (ref 0.44–1.00)
GLUCOSE: 95 mg/dL (ref 65–99)
HEMATOCRIT: 28 % — AB (ref 36.0–46.0)
HEMOGLOBIN: 9.5 g/dL — AB (ref 12.0–15.0)
Potassium: 4.9 mmol/L (ref 3.5–5.1)
Sodium: 138 mmol/L (ref 135–145)
TCO2: 29 mmol/L (ref 22–32)

## 2017-07-23 LAB — CREATININE, SERUM
Creatinine, Ser: 13.75 mg/dL — ABNORMAL HIGH (ref 0.44–1.00)
GFR calc non Af Amer: 3 mL/min — ABNORMAL LOW (ref >60)
GFR, EST AFRICAN AMERICAN: 3 mL/min — AB (ref >60)

## 2017-07-23 LAB — CBC
HEMATOCRIT: 26.4 % — AB (ref 36.0–46.0)
Hemoglobin: 8.2 g/dL — ABNORMAL LOW (ref 12.0–15.0)
MCH: 26.5 pg (ref 26.0–34.0)
MCHC: 31.1 g/dL (ref 30.0–36.0)
MCV: 85.2 fL (ref 78.0–100.0)
PLATELETS: 198 10*3/uL (ref 150–400)
RBC: 3.1 MIL/uL — AB (ref 3.87–5.11)
RDW: 18.5 % — AB (ref 11.5–15.5)
WBC: 9.9 10*3/uL (ref 4.0–10.5)

## 2017-07-23 LAB — I-STAT TROPONIN, ED: Troponin i, poc: 0.02 ng/mL (ref 0.00–0.08)

## 2017-07-23 MED ORDER — HEPARIN SODIUM (PORCINE) 1000 UNIT/ML DIALYSIS
100.0000 [IU]/kg | INTRAMUSCULAR | Status: DC | PRN
  Administered 2017-07-23 – 2017-07-24 (×2): 4000 [IU] via INTRAVENOUS_CENTRAL
  Filled 2017-07-23 (×3): qty 14

## 2017-07-23 MED ORDER — ONDANSETRON HCL 4 MG PO TABS: 4.0000 mg | ORAL_TABLET | Freq: Four times a day (QID) | ORAL | Status: DC | PRN

## 2017-07-23 MED ORDER — HEPARIN SODIUM (PORCINE) 1000 UNIT/ML DIALYSIS: 100.0000 [IU]/kg | INTRAMUSCULAR | Status: DC | PRN

## 2017-07-23 MED ORDER — CALCIUM ACETATE (PHOS BINDER) 667 MG PO CAPS
2668.0000 mg | ORAL_CAPSULE | Freq: Three times a day (TID) | ORAL | Status: DC
  Administered 2017-07-24 – 2017-07-25 (×3): 2668 mg via ORAL
  Filled 2017-07-23 (×5): qty 4

## 2017-07-23 MED ORDER — ISOSORBIDE MONONITRATE ER 30 MG PO TB24
30.0000 mg | ORAL_TABLET | Freq: Every day | ORAL | Status: DC
  Administered 2017-07-25: 30 mg via ORAL
  Filled 2017-07-23: qty 1

## 2017-07-23 MED ORDER — CALCITRIOL 0.5 MCG PO CAPS
3.5000 ug | ORAL_CAPSULE | ORAL | Status: DC
  Administered 2017-07-25: 3.5 ug via ORAL
  Filled 2017-07-23 (×2): qty 7

## 2017-07-23 MED ORDER — AMLODIPINE BESYLATE 10 MG PO TABS
10.0000 mg | ORAL_TABLET | Freq: Every day | ORAL | Status: DC
  Administered 2017-07-24 – 2017-07-25 (×3): 10 mg via ORAL
  Filled 2017-07-23 (×3): qty 1

## 2017-07-23 MED ORDER — ALTEPLASE 2 MG IJ SOLR: 2.0000 mg | Freq: Once | INTRAMUSCULAR | Status: DC | PRN

## 2017-07-23 MED ORDER — RENA-VITE PO TABS
1.0000 | ORAL_TABLET | Freq: Every day | ORAL | Status: DC
  Administered 2017-07-24 – 2017-07-25 (×3): 1 via ORAL
  Filled 2017-07-23 (×4): qty 1

## 2017-07-23 MED ORDER — MOMETASONE FURO-FORMOTEROL FUM 200-5 MCG/ACT IN AERO
2.0000 | INHALATION_SPRAY | Freq: Two times a day (BID) | RESPIRATORY_TRACT | Status: DC
  Administered 2017-07-24 – 2017-07-26 (×4): 2 via RESPIRATORY_TRACT
  Filled 2017-07-23 (×2): qty 8.8

## 2017-07-23 MED ORDER — SODIUM CHLORIDE 0.9 % IV SOLN: 100.0000 mL | INTRAVENOUS | Status: DC | PRN

## 2017-07-23 MED ORDER — SODIUM CHLORIDE 0.9 % IV SOLN
100.0000 mL | INTRAVENOUS | Status: DC | PRN
Start: 2017-07-23 — End: 2017-07-24

## 2017-07-23 MED ORDER — SODIUM CHLORIDE 0.9% FLUSH: 3.0000 mL | Freq: Two times a day (BID) | INTRAVENOUS | Status: DC

## 2017-07-23 MED ORDER — LIDOCAINE HCL (PF) 1 % IJ SOLN: 5.0000 mL | INTRAMUSCULAR | Status: DC | PRN

## 2017-07-23 MED ORDER — PENTAFLUOROPROP-TETRAFLUOROETH EX AERO: 1.0000 "application " | INHALATION_SPRAY | CUTANEOUS | Status: DC | PRN

## 2017-07-23 MED ORDER — ACETAMINOPHEN 325 MG PO TABS: 650.0000 mg | ORAL_TABLET | Freq: Four times a day (QID) | ORAL | Status: DC | PRN

## 2017-07-23 MED ORDER — ACETAMINOPHEN 650 MG RE SUPP: 650.0000 mg | Freq: Four times a day (QID) | RECTAL | Status: DC | PRN

## 2017-07-23 MED ORDER — METOPROLOL SUCCINATE ER 100 MG PO TB24
100.0000 mg | ORAL_TABLET | Freq: Every day | ORAL | Status: DC
  Administered 2017-07-24 – 2017-07-25 (×3): 100 mg via ORAL
  Filled 2017-07-23 (×4): qty 1

## 2017-07-23 MED ORDER — ALBUTEROL SULFATE (2.5 MG/3ML) 0.083% IN NEBU: 2.5000 mg | INHALATION_SOLUTION | Freq: Four times a day (QID) | RESPIRATORY_TRACT | Status: DC | PRN

## 2017-07-23 MED ORDER — HEPARIN SODIUM (PORCINE) 5000 UNIT/ML IJ SOLN
5000.0000 [IU] | Freq: Three times a day (TID) | INTRAMUSCULAR | Status: DC
  Administered 2017-07-23 – 2017-07-26 (×7): 5000 [IU] via SUBCUTANEOUS
  Filled 2017-07-23 (×9): qty 1

## 2017-07-23 MED ORDER — LOSARTAN POTASSIUM 50 MG PO TABS
100.0000 mg | ORAL_TABLET | Freq: Every day | ORAL | Status: DC
  Administered 2017-07-24 – 2017-07-25 (×3): 100 mg via ORAL
  Filled 2017-07-23 (×4): qty 2

## 2017-07-23 MED ORDER — SODIUM CHLORIDE 0.9 % IV SOLN: 250.0000 mL | INTRAVENOUS | Status: DC | PRN

## 2017-07-23 MED ORDER — SENNOSIDES-DOCUSATE SODIUM 8.6-50 MG PO TABS: 1.0000 | ORAL_TABLET | Freq: Every evening | ORAL | Status: DC | PRN

## 2017-07-23 MED ORDER — SODIUM CHLORIDE 0.9% FLUSH: 3.0000 mL | INTRAVENOUS | Status: DC | PRN

## 2017-07-23 MED ORDER — HEPARIN SODIUM (PORCINE) 1000 UNIT/ML DIALYSIS: 1000.0000 [IU] | INTRAMUSCULAR | Status: DC | PRN

## 2017-07-23 MED ORDER — ONDANSETRON HCL 4 MG/2ML IJ SOLN: 4.0000 mg | Freq: Four times a day (QID) | INTRAMUSCULAR | Status: DC | PRN

## 2017-07-23 MED ORDER — LIDOCAINE-PRILOCAINE 2.5-2.5 % EX CREA
1.0000 | TOPICAL_CREAM | CUTANEOUS | Status: DC | PRN
Start: 2017-07-23 — End: 2017-07-24

## 2017-07-23 NOTE — ED Notes (Signed)
Diet tray ordered. Pt sitting up on side of bed per request.

## 2017-07-23 NOTE — ED Notes (Signed)
Pt stated that her lunch meal was "throw up". Pt informed that she is on a renal diet. Pt asked for a pre-packed bag lunch (Malawiturkey sandwich and applesauce). Clelia SchaumannJessica - Charge RN informed. Pt given lunch bag, per Shanda BumpsJessica Metallurgist- Charge RN.

## 2017-07-23 NOTE — Progress Notes (Signed)
New Admission Note:   Arrival Method: stretcher from ED Mental Orientation: alert and oriented x 4  Telemetry: none  Assessment: Completed Skin: intact see flow sheet IV: none MD aware  Pain: denies  Tubes: none  Safety Measures: Safety Fall Prevention Plan has been given, discussed and signed Admission: Completed [facility] Orientation: Patient has been orientated to the room, unit and staff.  Family:none at the bedside   Orders have been reviewed and implemented. Will continue to monitor the patient. Call light has been placed within reach and bed alarm has been activated.   Nelma RothmanNatalie Araly Kaas, RN [facility]  Phone number: [phone]

## 2017-07-23 NOTE — Consult Note (Signed)
Reason for Consult:Volume overload Referring Physician: ED  Brittany Brock is an 51 y.o. female.  HPI: 50 yr female on HD in [place], here as transient.  Late for HD Sat at [facility] and chose not to go today to [facility] because transportation did not pick her up..  Documented had bus passes given .  SOB progressinve since last pm.  Denies edema. Has asthma also.   ESRD from HTN.  Review of systems not obtained due to patient factors.  [facility] . EDW 131 kg   Past Medical History:  Diagnosis Date  . Asthma   . Hypertension   . Renal disorder   . Sleep apnea     Past Surgical History:  Procedure Laterality Date  . AXILLARY VEIN - INTERNAL JUGULAR BYPASS GRAFT    . Graft      No family history on file.  Social History:  reports that she quit smoking about 9 months ago. Her smoking use included Cigarettes. She has a 150.00 pack-year smoking history. She has never used smokeless tobacco. She reports that she does not drink alcohol or use drugs.  Allergies:  Allergies  Allergen Reactions  . Penicillins Hives    Medications: I have reviewed the patient's current medications. Prior to Admission:  (Not in a hospital admission)  Calcitriol 3.5 mcg tiw.Micera 225 mcg iv q 2 wk   Results for orders placed or performed during the hospital encounter of 07/23/17 (from the past 48 hour(s))  I-Stat Troponin, ED (not at [facility])     Status: None   Collection Time: 07/23/17  6:00 AM  Result Value Ref Range   Troponin i, poc 0.02 0.00 - 0.08 ng/mL   Comment 3            Comment: Due to the release kinetics of cTnI, a negative result within the first hours of the onset of symptoms does not rule out myocardial infarction with certainty. If myocardial infarction is still suspected, repeat the test at appropriate intervals.   I-Stat Chem 8, ED     Status: Abnormal   Collection Time: 07/23/17  6:02 AM  Result Value Ref Range   Sodium 138 135 - 145 mmol/L   Potassium 4.9 3.5 - 5.1 mmol/L   Chloride 99 (L) 101 - 111 mmol/L   BUN 45 (H) 6 - 20 mg/dL   Creatinine, Ser 21.30 (H) 0.44 - 1.00 mg/dL   Glucose, Bld 95 65 - 99 mg/dL   Calcium, Ion 8.65 (L) 1.15 - 1.40 mmol/L   TCO2 29 22 - 32 mmol/L   Hemoglobin 9.5 (L) 12.0 - 15.0 g/dL   HCT 78.4 (L) 69.6 - 29.5 %    Dg Chest 2 View  Result Date: 07/22/2017 CLINICAL DATA:  Shortness of breath EXAM: CHEST  2 VIEW COMPARISON:  10/24/2016 FINDINGS: Cardiomegaly with central vascular congestion and mild diffuse interstitial opacities suggesting mild pulmonary edema. No large effusion. No focal consolidation. No pneumothorax. IMPRESSION: Cardiomegaly with central vascular congestion and mild diffuse interstitial edema Electronically Signed   By: Jasmine Pang M.D.   On: 07/22/2017 22:24    ROS Blood pressure (!) 189/88, pulse 84, temperature 97.8 F (36.6 C), temperature source Oral, resp. rate 18, SpO2 100 %. Physical Exam Physical Examination: General appearance - uncooperative and angry, NAD Mental status - agitated Mouth - mucous membranes moist, pharynx normal without lesions Lymphatics - PCL Chest - wheezing noted bilat , rales noted bibasilar, decreased air entry noted  bilat Heart -  S1 and S2 normal, S4 present, systolic murmur Gr2/6 at 2nd left intercostal space and LV lift Abdomen - obese, pos bs, l Extremities -  pedal edema 2 +, AVF on underside of upper arm.  Assessment/Plan: 1 Volume overload.  Needs HD , full tx. Social and psych issues contribute 2 ESRD: as above 3 Hypertension: not controlled , give meds,  4. 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

## 2017-07-23 NOTE — Progress Notes (Signed)
HD tx initiated via 15Gx2 w/o problem, pull/push/flush equally  W/o problem, VSS w/ increased bp, will cont to monitor while on HD tx

## 2017-07-23 NOTE — Discharge Instructions (Signed)
You were seen today for shortness of breath. You have some fluid on her lungs. You need to proceed directly to dialysis.

## 2017-07-23 NOTE — H&P (Signed)
History and Physical    Brittany Brock ZOX:[id number] DOB: 04/14/1966 DOA: 07/23/2017  Referring MD/NP/PA: Jodi Geralds, ED PA PCP: Patient, No Pcp Per  Patient coming from: Home  Chief Complaint: Needs dialysis  HPI: Brittany Brock is a 51 y.o. female with history of end-stage renal disease who dialyzes in [place] on Tuesday Thursday Saturday.  She is here visiting her sister and had prearranged dialysis here in [place].  On Saturday she arrived an hour late to dialysis due to transportation issues and only received a 3-hour session.  Today she chose not to go to her dialysis center again because of transportation issues and instead came to the emergency department.  She was given a bus pass and sent home.  She returns to the ED later in the afternoon because she states she was too short of breath to make it to the bus stop.  At this point it is close to 5 PM and will need dialysis in the hospital.  Nephrology has been consulted.  She does complain of mild shortness of breath.  Past Medical/Surgical History: Past Medical History:  Diagnosis Date  . Asthma   . Hypertension   . Renal disorder   . Sleep apnea     Past Surgical History:  Procedure Laterality Date  . AXILLARY VEIN - INTERNAL JUGULAR BYPASS GRAFT    . Graft      Social History:  reports that she quit smoking about 9 months ago. Her smoking use included Cigarettes. She has a 150.00 pack-year smoking history. She has never used smokeless tobacco. She reports that she does not drink alcohol or use drugs.  Allergies: Allergies  Allergen Reactions  . Penicillins Hives    Family History:  She is not aware of any family history of significance.  Prior to Admission medications   Medication Sig Start Date End Date Taking? Authorizing Provider  albuterol (PROVENTIL HFA;VENTOLIN HFA) 108 (90 Base) MCG/ACT inhaler Inhale 2 puffs into the lungs every 6 (six) hours as needed for wheezing or shortness of breath.   Yes  [provider]  calcium acetate (PHOSLO) 667 MG capsule Take 2,668 mg by mouth 3 (three) times daily with meals.   Yes [provider]  Fluticasone-Salmeterol (ADVAIR) 250-50 MCG/DOSE AEPB Inhale 1 puff into the lungs 2 (two) times daily.   Yes [provider]  isosorbide mononitrate (IMDUR) 30 MG 24 hr tablet Take 30 mg by mouth daily.   Yes [provider]  metoprolol (LOPRESSOR) 50 MG tablet Take 50 mg by mouth 2 (two) times daily.   Yes [provider]  azithromycin (ZITHROMAX Z-PAK) 250 MG tablet 2 po day one, then 1 daily x 4 days Patient not taking: Reported on 07/23/2017 10/24/16   Fayrene Helper, PA-C  benzonatate (TESSALON) 100 MG capsule Take 1 capsule (100 mg total) by mouth 3 (three) times daily as needed for cough. Patient not taking: Reported on 07/23/2017 10/24/16   Fayrene Helper, PA-C    Review of Systems:  Constitutional: Denies fever, chills, diaphoresis, appetite change and fatigue.  HEENT: Denies photophobia, eye pain, redness, hearing loss, ear pain, congestion, sore throat, rhinorrhea, sneezing, mouth sores, trouble swallowing, neck pain, neck stiffness and tinnitus.   Respiratory: Denies  cough, chest tightness,  and wheezing.   Cardiovascular: Denies chest pain, palpitations and leg swelling.  Gastrointestinal: Denies nausea, vomiting, abdominal pain, diarrhea, constipation, blood in stool and abdominal distention.  Genitourinary: Denies dysuria, urgency, frequency, hematuria, flank pain and difficulty urinating.  Endocrine: Denies: hot or cold intolerance, sweats, changes in hair or nails, polyuria, polydipsia. Musculoskeletal: Denies myalgias, back pain, joint swelling, arthralgias and gait problem.  Skin: Denies pallor, rash and wound.  Neurological: Denies dizziness, seizures, syncope, weakness, light-headedness, numbness and headaches.  Hematological: Denies adenopathy. Easy bruising, personal or family bleeding history    Psychiatric/Behavioral: Denies suicidal ideation, mood changes, confusion, nervousness, sleep disturbance and agitation    Physical Exam: Vitals:   07/23/17 1155  BP: (!) 189/88  Pulse: 84  Resp: 18  Temp: 97.8 F (36.6 C)  TempSrc: Oral  SpO2: 100%     Constitutional: NAD, calm, comfortable, morbidly obese. Eyes: PERRL, lids and conjunctivae normal ENMT: Mucous membranes are moist. Posterior pharynx clear of any exudate or lesions.Normal dentition.  Neck: normal, supple, no masses, no thyromegaly Respiratory: clear to auscultation bilaterally, no wheezing, no crackles. Normal respiratory effort. No accessory muscle use.  Cardiovascular: Regular rate and rhythm, no murmurs / rubs / gallops. No extremity edema. 2+ pedal pulses. No carotid bruits.  Abdomen: no tenderness, no masses palpated. No hepatosplenomegaly. Bowel sounds positive.  Musculoskeletal: no clubbing / cyanosis. No joint deformity upper and lower extremities. Good ROM, no contractures. Normal muscle tone.  Skin: no rashes, lesions, ulcers. No induration Neurologic: CN 2-12 grossly intact. Sensation intact, DTR normal. Strength 5/5 in all 4.  Psychiatric: Normal judgment and insight. Alert and oriented x 3. Normal mood.    Labs on Admission: I have personally reviewed the following labs and imaging studies  CBC:  Recent Labs Lab 07/23/17 0602  HGB 9.5*  HCT 28.0*   Basic Metabolic Panel:  Recent Labs Lab 07/23/17 0602  NA 138  K 4.9  CL 99*  GLUCOSE 95  BUN 45*  CREATININE 12.50*   GFR: CrCl cannot be calculated (Unknown ideal weight.). Liver Function Tests: No results for input(s): AST, ALT, ALKPHOS, BILITOT, PROT, ALBUMIN in the last 168 hours. No results for input(s): LIPASE, AMYLASE in the last 168 hours. No results for input(s): AMMONIA in the last 168 hours. Coagulation Profile: No results for input(s): INR, PROTIME in the last 168 hours. Cardiac Enzymes: No results for input(s):  CKTOTAL, CKMB, CKMBINDEX, TROPONINI in the last 168 hours. BNP (last 3 results) No results for input(s): PROBNP in the last 8760 hours. HbA1C: No results for input(s): HGBA1C in the last 72 hours. CBG: No results for input(s): GLUCAP in the last 168 hours. Lipid Profile: No results for input(s): CHOL, HDL, LDLCALC, TRIG, CHOLHDL, LDLDIRECT in the last 72 hours. Thyroid Function Tests: No results for input(s): TSH, T4TOTAL, FREET4, T3FREE, THYROIDAB in the last 72 hours. Anemia Panel: No results for input(s): VITAMINB12, FOLATE, FERRITIN, TIBC, IRON, RETICCTPCT in the last 72 hours. Urine analysis: No results found for: COLORURINE, APPEARANCEUR, LABSPEC, PHURINE, GLUCOSEU, HGBUR, BILIRUBINUR, KETONESUR, PROTEINUR, UROBILINOGEN, NITRITE, LEUKOCYTESUR Sepsis Labs: @LABRCNTIP (procalcitonin:4,lacticidven:4) )No results found for this or any previous visit (from the past 240 hour(s)).   Radiological Exams on Admission: Dg Chest 2 View  Result Date: 07/22/2017 CLINICAL DATA:  Shortness of breath EXAM: CHEST  2 VIEW COMPARISON:  10/24/2016 FINDINGS: Cardiomegaly with central vascular congestion and mild diffuse interstitial opacities suggesting mild pulmonary edema. No large effusion. No focal consolidation. No pneumothorax. IMPRESSION: Cardiomegaly with central vascular congestion and mild diffuse interstitial edema Electronically Signed   By: Jasmine PangKim  Fujinaga M.D.   On: 07/22/2017 22:24    EKG: Independently reviewed.  Normal sinus rhythm, LVH, no acute ischemic abnormalities  Assessment/Plan Active Problems:   Acute  pulmonary edema (HCC)   ESRD (end stage renal disease) (HCC)   HTN (hypertension)   Morbid obesity (HCC)    Acute pulmonary edema -Due to decreased dialysis session.   -As she has missed the window for dialysis today as an OP, will proceed with admission for HD only. -Nephrology has been contacted.  HTN -BP elevated. -Should improve with HD. -Resume home BP  meds.  Morbid Obesity -Counseled on diet and exercise.  ESRD -For HD today.   DVT prophylaxis: SQ heparin  Code Status: Full Code  Family Communication: Patient only  Disposition Plan: Admit for HD ; can DC home in am Consults called: Nephrology  Admission status: Observation    Time Spent: 85 minutes  Chaya Jan MD [facility] Pager [phone]  If 7PM-7AM, please contact night-coverage [email or web address] Password [email or web address]  07/23/2017, 5:06 PM

## 2017-07-23 NOTE — ED Triage Notes (Signed)
Pt just discharge from ED with sob and after getting home unable to get to her dialysis appointment. Last treatment on Saturday.

## 2017-07-23 NOTE — ED Provider Notes (Signed)
[facility] EMERGENCY DEPARTMENT Provider Note   CSN: [id number] Arrival date & time: 07/23/17  1150     History   Chief Complaint Chief Complaint  Patient presents with  . Vascular Access Problem    HPI  Brittany Brock is a 51 y.o. female with a history of ESRD on HD TThS, and hypertension who presents complaining of shortness of breath. She reports she last had dialysis on Saturday, and shortness of breath has been creasing. Patient is here from [place] but scheduled dialysis here while she was in town. Patient was seen by Dr. Wilkie Aye this morning, at this time she was satting 100% on room air, despite having shortness of breath and some vascular congestion noted on chest x-ray. She had dialysis scheduled for noon today, but reported that she did not have a ride, patient was given a bus pass. Patient reports that she was unable to walk to the bus stop due to her shortness of breath, so she returned here.       Past Medical History:  Diagnosis Date  . Asthma   . Hypertension   . Renal disorder   . Sleep apnea     There are no active problems to display for this patient.   Past Surgical History:  Procedure Laterality Date  . AXILLARY VEIN - INTERNAL JUGULAR BYPASS GRAFT    . Graft      OB History    No data available       Home Medications    Prior to Admission medications   Medication Sig Start Date End Date Taking? Authorizing Provider  albuterol (PROVENTIL HFA;VENTOLIN HFA) 108 (90 Base) MCG/ACT inhaler Inhale 2 puffs into the lungs every 6 (six) hours as needed for wheezing or shortness of breath.    [provider]  azithromycin (ZITHROMAX Z-PAK) 250 MG tablet 2 po day one, then 1 daily x 4 days Patient not taking: Reported on 07/23/2017 10/24/16   Fayrene Helper, PA-C  benzonatate (TESSALON) 100 MG capsule Take 1 capsule (100 mg total) by mouth 3 (three) times daily as needed for cough. Patient not taking: Reported on 07/23/2017  10/24/16   Fayrene Helper, PA-C  calcium acetate (PHOSLO) 667 MG capsule Take 2,668 mg by mouth 3 (three) times daily with meals.    [provider]  Fluticasone-Salmeterol (ADVAIR) 250-50 MCG/DOSE AEPB Inhale 1 puff into the lungs 2 (two) times daily.    [provider]  isosorbide mononitrate (IMDUR) 30 MG 24 hr tablet Take 30 mg by mouth daily.    [provider]  metoprolol (LOPRESSOR) 50 MG tablet Take 50 mg by mouth 2 (two) times daily.    [provider]    Family History No family history on file.  Social History Social History  Substance Use Topics  . Smoking status: Former Smoker    Packs/day: 5.00    Years: 30.00    Types: Cigarettes    Quit date: 09/30/2016  . Smokeless tobacco: Never Used  . Alcohol use No     Comment: Social     Allergies   Penicillins   Review of Systems Review of Systems  Constitutional: Negative for chills and fever.  HENT: Negative for congestion, rhinorrhea and sore throat.   Eyes: Negative for visual disturbance.  Respiratory: Positive for shortness of breath.   Cardiovascular: Negative for leg swelling.  Gastrointestinal: Negative for abdominal pain, nausea and vomiting.  Genitourinary: Negative for dysuria.  Musculoskeletal: Negative for arthralgias.  Skin: Negative for rash.  Neurological: Negative for dizziness, weakness and numbness.     Physical Exam Updated Vital Signs BP (!) 189/88 (BP Location: Left Arm)   Pulse 84   Temp 97.8 F (36.6 C) (Oral)   Resp 18   SpO2 100%   Physical Exam  Constitutional: She appears well-developed and well-nourished. No distress.  HENT:  Head: Normocephalic and atraumatic.  Eyes: Right eye exhibits no discharge. Left eye exhibits no discharge.  Cardiovascular: Normal rate, regular rhythm, normal heart sounds and intact distal pulses.   Pulmonary/Chest: Effort normal. No respiratory distress. She has wheezes. She has rales.  Patient has wheezes and  crackles in all lung fields  Abdominal: Soft. Bowel sounds are normal. She exhibits no distension. There is no tenderness. There is no guarding.  Musculoskeletal: She exhibits no edema.  Neurological: She is alert. Coordination normal.  Skin: Skin is warm and dry. She is not diaphoretic.  Psychiatric: She has a normal mood and affect. Her behavior is normal.  Nursing note and vitals reviewed.    ED Treatments / Results  Labs (all labs ordered are listed, but only abnormal results are displayed) Labs Reviewed - No data to display  EKG  EKG Interpretation None       Radiology Dg Chest 2 View  Result Date: 07/22/2017 CLINICAL DATA:  Shortness of breath EXAM: CHEST  2 VIEW COMPARISON:  10/24/2016 FINDINGS: Cardiomegaly with central vascular congestion and mild diffuse interstitial opacities suggesting mild pulmonary edema. No large effusion. No focal consolidation. No pneumothorax. IMPRESSION: Cardiomegaly with central vascular congestion and mild diffuse interstitial edema Electronically Signed   By: Jasmine PangKim  Fujinaga M.D.   On: 07/22/2017 22:24    Procedures Procedures (including critical care time)  Medications Ordered in ED Medications - No data to display   Initial Impression / Assessment and Plan / ED Course  I have reviewed the triage vital signs and the nursing notes.  Pertinent labs & imaging results that were available during my care of the patient were reviewed by me and considered in my medical decision making (see chart for details).  She presents with shortness of breath, she's not had dialysis since Saturday and was unable to get to her dialysis appointment today at noon due to worsening shortness of breath. Ambulatory O2 sats on room air dropped to 88%. Patient does have diffuse rales on exam and some pulmonary edema is evident on chest x-ray from last night. Patient on 4 L nasal cannula here in the ED and is breathing comfortably and speaking in full sentences, no  apparent distress. Patient is hypertensive, vitals are otherwise normal.  Spoke with nephrology will see patient and plan for dialysis, because patient is new to the practice patient will need to be formally admitted. [facility] consultation, spoke with Dr. Ardyth HarpsHernandez who will see and admit the patient.  Final Clinical Impressions(s) / ED Diagnoses   Final diagnoses:  ESRD (end stage renal disease) on dialysis [facility](HCC)  Shortness of breath    New Prescriptions New Prescriptions   No medications on file         Legrand RamsFord, Diago Haik N, PA-C 07/23/17 1634    Arby BarrettePfeiffer, Marcy, MD 07/27/17 [phone]

## 2017-07-23 NOTE — ED Notes (Signed)
Hooked patient up to the monitor patient is resting 

## 2017-07-23 NOTE — ED Provider Notes (Signed)
[facility] EMERGENCY DEPARTMENT Provider Note   CSN: [id number] Arrival date & time: 07/22/17  2141     History   Chief Complaint Chief Complaint  Patient presents with  . Shortness of Breath    HPI Brittany Brock is a 51 y.o. female.  HPI  This is a 51 year old female with history of end-stage renal disease on dialysis Tuesday, Thursday, Saturday, hypertension who presents with shortness of breath. Patient reports increasing shortness of breath and orthopnea over the last several days. She is from [place] but has been getting her normal dialysis. She last dialyzed on Saturday. She denies any coughs or fevers. She denies any chest pain.or leg swelling. She does report dyspnea on exertion. She does not want oxygen at home.  Past Medical History:  Diagnosis Date  . Asthma   . Hypertension   . Renal disorder   . Sleep apnea     There are no active problems to display for this patient.   Past Surgical History:  Procedure Laterality Date  . AXILLARY VEIN - INTERNAL JUGULAR BYPASS GRAFT    . Graft      OB History    No data available       Home Medications    Prior to Admission medications   Medication Sig Start Date End Date Taking? Authorizing Provider  albuterol (PROVENTIL HFA;VENTOLIN HFA) 108 (90 Base) MCG/ACT inhaler Inhale 2 puffs into the lungs every 6 (six) hours as needed for wheezing or shortness of breath.   Yes [provider]  calcium acetate (PHOSLO) 667 MG capsule Take 2,668 mg by mouth 3 (three) times daily with meals.   Yes [provider]  Fluticasone-Salmeterol (ADVAIR) 250-50 MCG/DOSE AEPB Inhale 1 puff into the lungs 2 (two) times daily.   Yes [provider]  isosorbide mononitrate (IMDUR) 30 MG 24 hr tablet Take 30 mg by mouth daily.   Yes [provider]  metoprolol (LOPRESSOR) 50 MG tablet Take 50 mg by mouth 2 (two) times daily.   Yes [provider]  azithromycin (ZITHROMAX  Z-PAK) 250 MG tablet 2 po day one, then 1 daily x 4 days Patient not taking: Reported on 07/23/2017 10/24/16   Fayrene Helper, PA-C  benzonatate (TESSALON) 100 MG capsule Take 1 capsule (100 mg total) by mouth 3 (three) times daily as needed for cough. Patient not taking: Reported on 07/23/2017 10/24/16   Fayrene Helper, PA-C    Family History History reviewed. No pertinent family history.  Social History Social History  Substance Use Topics  . Smoking status: Former Smoker    Packs/day: 5.00    Years: 30.00    Types: Cigarettes    Quit date: 09/30/2016  . Smokeless tobacco: Never Used  . Alcohol use No     Comment: Social     Allergies   Penicillins   Review of Systems Review of Systems  Constitutional: Negative for fever.  Respiratory: Positive for cough and shortness of breath. Negative for chest tightness.   Cardiovascular: Negative for chest pain and leg swelling.  Gastrointestinal: Negative for abdominal pain, nausea and vomiting.  Genitourinary: Negative for dysuria.  All other systems reviewed and are negative.    Physical Exam Updated Vital Signs BP (!) 190/103   Pulse 81   Temp 99 F (37.2 C) (Oral)   Resp 16   SpO2 100%   Physical Exam  Constitutional: She is oriented to person, place, and time. No distress.  Morbidly obese  HENT:  Head: Normocephalic and atraumatic.  Cardiovascular: Normal rate, regular rhythm and normal heart sounds.   Pulmonary/Chest: Effort normal. No respiratory distress. She has wheezes.  crackles in all lung fields  Abdominal: Soft. There is no tenderness.  Musculoskeletal:  Trace lower extremity edema  Neurological: She is alert and oriented to person, place, and time.  Skin: Skin is warm and dry.  Psychiatric: She has a normal mood and affect.  Nursing note and vitals reviewed.    ED Treatments / Results  Labs (all labs ordered are listed, but only abnormal results are displayed) Labs Reviewed  I-STAT CHEM 8, ED -  Abnormal; Notable for the following:       Result Value   Chloride 99 (*)    BUN 45 (*)    Creatinine, Ser 12.50 (*)    Calcium, Ion 1.13 (*)    Hemoglobin 9.5 (*)    HCT 28.0 (*)    All other components within normal limits  I-STAT TROPONIN, ED    EKG  EKG Interpretation  Date/Time:  Monday July 22 2017 21:59:24 EDT Ventricular Rate:  92 PR Interval:  140 QRS Duration: 84 QT Interval:  354 QTC Calculation: 437 R Axis:   -19 Text Interpretation:  Sinus rhythm with Premature supraventricular complexes Minimal voltage criteria for LVH, may be normal variant Borderline ECG No significant change since last tracing Confirmed by Ross Marcus ([id number]) on 07/23/2017 5:40:14 AM       Radiology Dg Chest 2 View  Result Date: 07/22/2017 CLINICAL DATA:  Shortness of breath EXAM: CHEST  2 VIEW COMPARISON:  10/24/2016 FINDINGS: Cardiomegaly with central vascular congestion and mild diffuse interstitial opacities suggesting mild pulmonary edema. No large effusion. No focal consolidation. No pneumothorax. IMPRESSION: Cardiomegaly with central vascular congestion and mild diffuse interstitial edema Electronically Signed   By: Jasmine Pang M.D.   On: 07/22/2017 22:24    Procedures Procedures (including critical care time)  Medications Ordered in ED Medications  albuterol (PROVENTIL) (2.5 MG/3ML) 0.083% nebulizer solution 5 mg (5 mg Nebulization Not Given 07/22/17 2205)     Initial Impression / Assessment and Plan / ED Course  I have reviewed the triage vital signs and the nursing notes.  Pertinent labs & imaging results that were available during my care of the patient were reviewed by me and considered in my medical decision making (see chart for details).     Patient presents with shortness of breath. She is nontoxic-appearing. She is morbidly obese. Room air sat is 100% without oxygen. She does prefer to wear oxygen for comfort. She has crackles and wheezing in all lung  fields. Chest x-ray reviewed from triage. It is suggestive of pulmonary edema. There are no pulmonary effusions. Potassium is normal. Troponin is negative. She is notably hypertensive but this is likely related to her known renovascular disease. EKG shows LVH changes. On recheck, patient is sleeping.Without oxygen her O2 sats are 100%. Discuss with patient that she needs to go to her regularly scheduled dialysis later today. She states that she does not have a ride. She was given a bus pass.  After history, exam, and medical workup I feel the patient has been appropriately medically screened and is safe for discharge home. Pertinent diagnoses were discussed with the patient. Patient was given return precautions.     Final Clinical Impressions(s) / ED Diagnoses   Final diagnoses:  Acute pulmonary edema (HCC)  Renovascular hypertension    New Prescriptions New Prescriptions   No medications on  file     Horton, CourShon Batontney F, MD 07/23/17 [phone]

## 2017-07-23 NOTE — ED Notes (Signed)
Walked patient on room air patient oxy level dropped down to 88 and 89 on room air patient is back in bed resting

## 2017-07-24 DIAGNOSIS — N186 End stage renal disease: Secondary | ICD-10-CM | POA: Diagnosis not present

## 2017-07-24 DIAGNOSIS — J810 Acute pulmonary edema: Secondary | ICD-10-CM

## 2017-07-24 LAB — CBC
HCT: 27.5 % — ABNORMAL LOW (ref 36.0–46.0)
Hemoglobin: 8.4 g/dL — ABNORMAL LOW (ref 12.0–15.0)
MCH: 26.3 pg (ref 26.0–34.0)
MCHC: 30.5 g/dL (ref 30.0–36.0)
MCV: 86.2 fL (ref 78.0–100.0)
Platelets: 200 10*3/uL (ref 150–400)
RBC: 3.19 MIL/uL — AB (ref 3.87–5.11)
RDW: 18.9 % — ABNORMAL HIGH (ref 11.5–15.5)
WBC: 7.3 10*3/uL (ref 4.0–10.5)

## 2017-07-24 LAB — HIV ANTIBODY (ROUTINE TESTING W REFLEX): HIV SCREEN 4TH GENERATION: NONREACTIVE

## 2017-07-24 LAB — RENAL FUNCTION PANEL
ALBUMIN: 3.3 g/dL — AB (ref 3.5–5.0)
Anion gap: 11 (ref 5–15)
BUN: 23 mg/dL — AB (ref 6–20)
CO2: 29 mmol/L (ref 22–32)
CREATININE: 8.04 mg/dL — AB (ref 0.44–1.00)
Calcium: 9.4 mg/dL (ref 8.9–10.3)
Chloride: 96 mmol/L — ABNORMAL LOW (ref 101–111)
GFR calc non Af Amer: 5 mL/min — ABNORMAL LOW (ref >60)
GFR, EST AFRICAN AMERICAN: 6 mL/min — AB (ref >60)
Glucose, Bld: 126 mg/dL — ABNORMAL HIGH (ref 65–99)
PHOSPHORUS: 5.7 mg/dL — AB (ref 2.5–4.6)
Potassium: 4.1 mmol/L (ref 3.5–5.1)
Sodium: 136 mmol/L (ref 135–145)

## 2017-07-24 LAB — MRSA PCR SCREENING: MRSA BY PCR: NEGATIVE

## 2017-07-24 MED ORDER — ALTEPLASE 2 MG IJ SOLR: 2.0000 mg | Freq: Once | INTRAMUSCULAR | Status: DC | PRN

## 2017-07-24 MED ORDER — SODIUM CHLORIDE 0.9 % IV SOLN: 100.0000 mL | INTRAVENOUS | Status: DC | PRN

## 2017-07-24 MED ORDER — LIDOCAINE-PRILOCAINE 2.5-2.5 % EX CREA: 1.0000 "application " | TOPICAL_CREAM | CUTANEOUS | Status: DC | PRN

## 2017-07-24 MED ORDER — LIDOCAINE HCL (PF) 1 % IJ SOLN: 5.0000 mL | INTRAMUSCULAR | Status: DC | PRN

## 2017-07-24 MED ORDER — LOSARTAN POTASSIUM 100 MG PO TABS: 100.0000 mg | ORAL_TABLET | Freq: Every day | ORAL | 0 refills | Status: DC

## 2017-07-24 MED ORDER — GUAIFENESIN 100 MG/5ML PO SOLN
5.0000 mL | Freq: Four times a day (QID) | ORAL | Status: DC | PRN
  Administered 2017-07-24: 100 mg via ORAL
  Filled 2017-07-24: qty 5

## 2017-07-24 MED ORDER — PENTAFLUOROPROP-TETRAFLUOROETH EX AERO: 1.0000 "application " | INHALATION_SPRAY | CUTANEOUS | Status: DC | PRN

## 2017-07-24 MED ORDER — HEPARIN SODIUM (PORCINE) 1000 UNIT/ML DIALYSIS
100.0000 [IU]/kg | INTRAMUSCULAR | Status: DC | PRN
  Filled 2017-07-24: qty 14

## 2017-07-24 MED ORDER — HEPARIN SODIUM (PORCINE) 1000 UNIT/ML DIALYSIS: 1000.0000 [IU] | INTRAMUSCULAR | Status: DC | PRN

## 2017-07-24 NOTE — Discharge Summary (Addendum)
Physician Discharge Summary  Margaruite Top ZOX:[id number] DOB: 1966/03/08 DOA: 07/23/2017  PCP: Patient, No Pcp Per  Admit date: 07/23/2017 Discharge date: 07/26/2017  Admitted From:home Disposition: home  Recommendations for Outpatient Follow-up:  1. Follow up with PCP in 1-2 weeks 2. Please obtain BMP/CBC in one week   Home Health:no Equipment/Devices:none Discharge Condition:stable CODE STATUS:full code Diet recommendation:heart healthy  Brief/Interim Summary: 51 year old female with history of ESRD on hemodialysis, hypertension who is visiting from [place] presented to the hospital for hemodialysis treatment. She is here visiting her sister and had arrangement for  Dialysis in [place]. She missed her treatment because of transportation issue. Patient with shortness of breath and pulmonary vascular congestion due to noncompliance exam without treatment. She required oxygen. She received hemodialysis treatment with significant clinical improvement. Receiving other hematology today. Patient feels better. Denied chest pain, shortness of breath, nausea or vomiting. I discussed about importance of continuing outpatient hematology tr Discussed with the social worker and case manager in the morning meeting and social worker was consulted to evaluate patient's social situation including transportation. At this time patient is medically stable. Blood pressure elevated likely compliance and because of fluid overload. Continue home medications and added losartan. Recommended to monitor blood pressure and low salt diet.  Stable to discharge when social issues resolves.   Discharge Diagnoses:  Active Problems:   Acute pulmonary edema (HCC)   ESRD (end stage renal disease) on dialysis (HCC)   HTN (hypertension)   Morbid obesity (HCC)    Discharge Instructions  Discharge Instructions    Call MD for:  difficulty breathing, headache or visual disturbances    Complete by:  As directed     Call MD for:  extreme fatigue    Complete by:  As directed    Call MD for:  hives    Complete by:  As directed    Call MD for:  persistant dizziness or light-headedness    Complete by:  As directed    Call MD for:  persistant nausea and vomiting    Complete by:  As directed    Call MD for:  severe uncontrolled pain    Complete by:  As directed    Call MD for:  temperature >100.4    Complete by:  As directed    Diet - low sodium heart healthy    Complete by:  As directed    Discharge instructions    Complete by:  As directed    Please continue your outpatient hemodialysis as scheduled and planned.   Increase activity slowly    Complete by:  As directed      Allergies as of 07/26/2017      Reactions   Penicillins Hives      Medication List    STOP taking these medications   azithromycin 250 MG tablet Commonly known as:  ZITHROMAX Z-PAK   benzonatate 100 MG capsule Commonly known as:  TESSALON     TAKE these medications   albuterol 108 (90 Base) MCG/ACT inhaler Commonly known as:  PROVENTIL HFA;VENTOLIN HFA Inhale 2 puffs into the lungs every 6 (six) hours as needed for wheezing or shortness of breath.   calcium acetate 667 MG capsule Commonly known as:  PHOSLO Take 2,668 mg by mouth 3 (three) times daily with meals.   Fluticasone-Salmeterol 250-50 MCG/DOSE Aepb Commonly known as:  ADVAIR Inhale 1 puff into the lungs 2 (two) times daily.   isosorbide mononitrate 30 MG 24 hr tablet Commonly known as:  IMDUR Take 30 mg by mouth daily.   metoprolol tartrate 50 MG tablet Commonly known as:  LOPRESSOR Take 50 mg by mouth 2 (two) times daily.      Follow-up Information    [facility]. Schedule an appointment as soon as possible for a visit in 1 week(s).   Why:  continue your outpatient dialysis treatment. Contact information: [address] [place] [address] [phone]       [facility]  Follow up.   Contact information: [address] [place] [address] [phone]          Allergies  Allergen Reactions  . Penicillins Hives    Consultations: nephrologist  Procedures/Studies: dialysis  Subjective: Seen and examined at bedside. Feels better after hemodialysis treatment.Denied nausea vomiting and shortness of breath.  Discharge Exam: Vitals:   07/26/17 1339 07/26/17 1607  BP: 117/75 (!) 100/55  Pulse: (!) 56 66  Resp: (!) 28 20  Temp: 98.1 F (36.7 C) 97.9 F (36.6 C)  SpO2:  95%   Vitals:   07/26/17 1200 07/26/17 1230 07/26/17 1339 07/26/17 1607  BP:  116/72 117/75 (!) 100/55  Pulse: (!) 52 70 (!) 56 66  Resp:   (!) 28 20  Temp:   98.1 F (36.7 C) 97.9 F (36.6 C)  TempSrc:   Oral Oral  SpO2:    95%  Weight:   128.3 kg (282 lb 13.6 oz)   Height:        General: Pt is alert, awake, not in acute distress Cardiovascular: RRR, S1/S2 +, no rubs, no gallops Respiratory: CTA bilaterally, no wheezing, no rhonchi Abdominal: Soft, NT, ND, bowel sounds + Extremities: no edema, no cyanosis    The results of significant diagnostics from this hospitalization (including imaging, microbiology, ancillary and laboratory) are listed below for reference.     Microbiology: Recent Results (from the past 240 hour(s))  MRSA PCR Screening     Status: None   Collection Time: 07/23/17  8:28 PM  Result Value Ref Range Status   MRSA by PCR NEGATIVE NEGATIVE Final    Comment:        The GeneXpert MRSA Assay (FDA approved for NASAL specimens only), is one component of a comprehensive MRSA colonization surveillance program. It is not intended to diagnose MRSA infection nor to guide or monitor treatment for MRSA infections.      Labs: BNP (last 3 results) No results for input(s): BNP in the last 8760 hours. Basic Metabolic Panel:  Recent Labs Lab 07/23/17 0602 07/23/17 2100 07/23/17 2237 07/24/17 1400  NA 138 136  --  136  K 4.9 4.6  --  4.1   CL 99* 96*  --  96*  CO2  --  27  --  29  GLUCOSE 95 108*  --  126*  BUN 45* 50*  --  23*  CREATININE 12.50* 13.52* 13.75* 8.04*  CALCIUM  --  9.2  --  9.4  PHOS  --  7.5*  --  5.7*   Liver Function Tests:  Recent Labs Lab 07/23/17 2100 07/24/17 1400  ALBUMIN 3.3* 3.3*   No results for input(s): LIPASE, AMYLASE in the last 168 hours. No results for input(s): AMMONIA in the last 168 hours. CBC:  Recent Labs Lab 07/23/17 0602 07/23/17 2237 07/24/17 1400  WBC  --  9.9 7.3  HGB 9.5* 8.2* 8.4*  HCT 28.0* 26.4* 27.5*  MCV  --  85.2 86.2  PLT  --  198 200   Cardiac Enzymes: No results for input(s): CKTOTAL, CKMB, CKMBINDEX, TROPONINI in the last 168 hours. BNP: Invalid input(s): POCBNP CBG: No results for input(s): GLUCAP in the last 168 hours. D-Dimer No results for input(s): DDIMER in the last 72 hours. Hgb A1c No results for input(s): HGBA1C in the last 72 hours. Lipid Profile No results for input(s): CHOL, HDL, LDLCALC, TRIG, CHOLHDL, LDLDIRECT in the last 72 hours. Thyroid function studies No results for input(s): TSH, T4TOTAL, T3FREE, THYROIDAB in the last 72 hours.  Invalid input(s): FREET3 Anemia work up No results for input(s): VITAMINB12, FOLATE, FERRITIN, TIBC, IRON, RETICCTPCT in the last 72 hours. Urinalysis No results found for: COLORURINE, APPEARANCEUR, LABSPEC, PHURINE, GLUCOSEU, HGBUR, BILIRUBINUR, KETONESUR, PROTEINUR, UROBILINOGEN, NITRITE, LEUKOCYTESUR Sepsis Labs Invalid input(s): PROCALCITONIN,  WBC,  LACTICIDVEN Microbiology Recent Results (from the past 240 hour(s))  MRSA PCR Screening     Status: None   Collection Time: 07/23/17  8:28 PM  Result Value Ref Range Status   MRSA by PCR NEGATIVE NEGATIVE Final    Comment:        The GeneXpert MRSA Assay (FDA approved for NASAL specimens only), is one component of a comprehensive MRSA colonization surveillance program. It is not intended to diagnose MRSA infection nor to guide  or monitor treatment for MRSA infections.      Time coordinating discharge: 27 minutes  SIGNED:   Maxie Barbron Prasad Jaana Brodt, MD  [facility] 07/26/2017, 5:26 PM  If 7PM-7AM, please contact night-coverage [email or web address] Password [email or web address]

## 2017-07-24 NOTE — Care Management Obs Status (Signed)
MEDICARE OBSERVATION STATUS NOTIFICATION   Patient Details  Name: Brittany Brock MRN: [id number] Date of Birth: 25-Jul-1966   Medicare Observation Status Notification Given:  Yes    Lawerance Sabalebbie Azarian Starace, RN 07/24/2017, 10:02 AM

## 2017-07-24 NOTE — Progress Notes (Signed)
Subjective: Interval History: has complaints better, but still SOB with exertion here, trying to walk.  Objective: Vital signs in last 24 hours: Temp:  [97.8 F (36.6 C)-99.4 F (37.4 C)] 98.5 F (36.9 C) (10/24 0507) Pulse Rate:  [61-86] 79 (10/24 0507) Resp:  [15-29] 22 (10/24 0507) BP: (143-200)/(78-108) 193/105 (10/24 0507) SpO2:  [97 %-100 %] 97 % (10/24 0507) Weight:  [131 kg (288 lb 12.8 oz)-135.5 kg (298 lb 11.6 oz)] 131 kg (288 lb 12.8 oz) (10/24 0307) Weight change:   Intake/Output from previous day: 10/23 0701 - 10/24 0700 In: 240 [P.O.:240] Out: 4500  Intake/Output this shift: No intake/output data recorded.  General appearance: cooperative, no distress and morbidly obese Resp: diminished breath sounds bilaterally, rales bibasilar and wheezes bilaterally Cardio: S1, S2 normal and systolic murmur: systolic ejection 2/6, decrescendo at 2nd left intercostal space GI: obese, pos bs,liver down 6 cm Extremities: edema 2+ and AVF RUA  Lab Results:  Recent Labs  07/23/17 0602 07/23/17 2237  WBC  --  9.9  HGB 9.5* 8.2*  HCT 28.0* 26.4*  PLT  --  198   BMET:  Recent Labs  07/23/17 0602 07/23/17 2100 07/23/17 2237  NA 138 136  --   K 4.9 4.6  --   CL 99* 96*  --   CO2  --  27  --   GLUCOSE 95 108*  --   BUN 45* 50*  --   CREATININE 12.50* 13.52* 13.75*  CALCIUM  --  9.2  --    No results for input(s): PTH in the last 72 hours. Iron Studies: No results for input(s): IRON, TIBC, TRANSFERRIN, FERRITIN in the last 72 hours.  Studies/Results: Dg Chest 2 View  Result Date: 07/22/2017 CLINICAL DATA:  Shortness of breath EXAM: CHEST  2 VIEW COMPARISON:  10/24/2016 FINDINGS: Cardiomegaly with central vascular congestion and mild diffuse interstitial opacities suggesting mild pulmonary edema. No large effusion. No focal consolidation. No pneumothorax. IMPRESSION: Cardiomegaly with central vascular congestion and mild diffuse interstitial edema Electronically  Signed   By: Jasmine PangKim  Fujinaga M.D.   On: 07/22/2017 22:24    I have reviewed the patient's current medications.  Assessment/Plan: 1 ESRD still vol xs. Do HD again, lower dry 2 HTN lower vol 3 Anemia 4 HPth Vit D 5 Obesity 6 Asthma some role in resp issues P Hd, bp meds. Ok to d/c after HD    LOS: 0 days   Yvon Mccord L 07/24/2017,8:01 AM

## 2017-07-24 NOTE — Procedures (Signed)
I was present at this session.  I have reviewed the session itself and made appropriate changes.  Bp coming down on HD, estab new dry.  Lower meds  Ariann Khaimov L 10/24/20184:50 PM

## 2017-07-24 NOTE — Progress Notes (Signed)
Patient supposed to be discharged but has no transportation to and from outpatient hemodialysis. Per patient,"I will end up coming back here in the hospital because I don't have transportation." CSW,Vanessa spoke with patient at bedside. MD,Kakrakandy made aware of patient's predicament.Order to hold discharge until tomorrow. Terah Robey, Drinda Buttsharito Joselita, RCharity fundraiser

## 2017-07-24 NOTE — Progress Notes (Signed)
HD tx completed @ 0230 w/ increased bp throughout most of tx, MD paged to make aware and request for prn bp med, BP eventually came down toward end of tx. At beginning of the tx pt's breath sounds were rhonchi and upper air way congestion that was audible across the room, at the end of tx that was less audible. UF goal met, blood rinsed back, VSS w/ bp finally down, report called to Vinie Sill, RN

## 2017-07-25 ENCOUNTER — Encounter (HOSPITAL_COMMUNITY): Payer: Self-pay | Admitting: General Practice

## 2017-07-25 DIAGNOSIS — Z87891 Personal history of nicotine dependence: Secondary | ICD-10-CM | POA: Diagnosis not present

## 2017-07-25 DIAGNOSIS — J9601 Acute respiratory failure with hypoxia: Secondary | ICD-10-CM | POA: Diagnosis present

## 2017-07-25 DIAGNOSIS — Z609 Problem related to social environment, unspecified: Secondary | ICD-10-CM | POA: Diagnosis present

## 2017-07-25 DIAGNOSIS — Z992 Dependence on renal dialysis: Secondary | ICD-10-CM | POA: Diagnosis not present

## 2017-07-25 DIAGNOSIS — E8770 Fluid overload, unspecified: Secondary | ICD-10-CM | POA: Diagnosis present

## 2017-07-25 DIAGNOSIS — Z9119 Patient's noncompliance with other medical treatment and regimen: Secondary | ICD-10-CM | POA: Diagnosis not present

## 2017-07-25 DIAGNOSIS — Z9115 Patient's noncompliance with renal dialysis: Secondary | ICD-10-CM | POA: Diagnosis not present

## 2017-07-25 DIAGNOSIS — J810 Acute pulmonary edema: Secondary | ICD-10-CM | POA: Diagnosis not present

## 2017-07-25 DIAGNOSIS — I160 Hypertensive urgency: Secondary | ICD-10-CM | POA: Diagnosis present

## 2017-07-25 DIAGNOSIS — R0602 Shortness of breath: Secondary | ICD-10-CM | POA: Diagnosis present

## 2017-07-25 DIAGNOSIS — N186 End stage renal disease: Secondary | ICD-10-CM | POA: Diagnosis not present

## 2017-07-25 DIAGNOSIS — G4730 Sleep apnea, unspecified: Secondary | ICD-10-CM | POA: Diagnosis present

## 2017-07-25 DIAGNOSIS — Z79899 Other long term (current) drug therapy: Secondary | ICD-10-CM | POA: Diagnosis not present

## 2017-07-25 DIAGNOSIS — Z880 Allergy status to penicillin: Secondary | ICD-10-CM | POA: Diagnosis not present

## 2017-07-25 DIAGNOSIS — I120 Hypertensive chronic kidney disease with stage 5 chronic kidney disease or end stage renal disease: Secondary | ICD-10-CM | POA: Diagnosis present

## 2017-07-25 DIAGNOSIS — D631 Anemia in chronic kidney disease: Secondary | ICD-10-CM | POA: Diagnosis present

## 2017-07-25 DIAGNOSIS — Z6841 Body Mass Index (BMI) 40.0 and over, adult: Secondary | ICD-10-CM | POA: Diagnosis not present

## 2017-07-25 DIAGNOSIS — E8889 Other specified metabolic disorders: Secondary | ICD-10-CM | POA: Diagnosis present

## 2017-07-25 DIAGNOSIS — Z7951 Long term (current) use of inhaled steroids: Secondary | ICD-10-CM | POA: Diagnosis not present

## 2017-07-25 DIAGNOSIS — J45909 Unspecified asthma, uncomplicated: Secondary | ICD-10-CM | POA: Diagnosis present

## 2017-07-25 LAB — HEPATITIS B SURFACE ANTIGEN: HEP B S AG: NEGATIVE

## 2017-07-25 LAB — HEPATITIS B SURFACE ANTIBODY,QUALITATIVE: HEP B S AB: REACTIVE

## 2017-07-25 MED ORDER — CALCIUM CARBONATE ANTACID 500 MG PO CHEW
1.0000 | CHEWABLE_TABLET | Freq: Three times a day (TID) | ORAL | Status: DC | PRN
  Administered 2017-07-25 – 2017-07-26 (×3): 200 mg via ORAL
  Filled 2017-07-25 (×3): qty 1

## 2017-07-25 MED ORDER — GI COCKTAIL ~~LOC~~
30.0000 mL | Freq: Once | ORAL | Status: AC
  Administered 2017-07-25: 30 mL via ORAL
  Filled 2017-07-25: qty 30

## 2017-07-25 NOTE — Progress Notes (Signed)
Patient was seen and examined at bedside. Patient was discharged yesterday however did not leave because of problem with transportation. Patient remains medically stable and ready to discharge home since 10/24 afternoon. Discussed with the social worker and case manager regarding issue with  transportation to hemodialysis.  Patient denied headache, dizziness, nausea, vomiting, chest pain or shortness of breath. Physical exam is unchanged. Continue current medical and supportive care while in the hospital.

## 2017-07-25 NOTE — Care Management Note (Signed)
Case Management Note  Patient Details  Name: Brittany Brock MRN: [id number] Date of Birth: 1966-04-23  Subjective/Objective:      CM following for progression and d/c planning.               Action/Plan: 07/25/2017 Met with pt re d/c needs. HD secretary, Freda Munro has secured a HD spot and time for this pt at [facility] for MWF. Pt still stating that she has transportation issues. CSW and this CM working on possible city bus transportation and assisting pt with purchase of tickets for ongoing rides to the HD center.   Pt is not happy with MWF schedule, however has agreed to accept this and attempt to change the schedule after she begins this schedule at her new center.    Expected Discharge Date:  07/25/17               Expected Discharge Plan:  Home/Self Care  In-House Referral:  Clinical Social Work  Discharge planning Services  CM Consult  Post Acute Care Choice:  NA Choice offered to:  NA  DME Arranged:  N/A DME Agency:  NA  HH Arranged:  NA HH Agency:  NA  Status of Service:  Completed, signed off  If discussed at [employer] of Stay Meetings, dates discussed:    Additional Comments:  Adron Bene, RN 07/25/2017, 3:58 PM

## 2017-07-25 NOTE — Clinical Social Work Note (Addendum)
CSW talked with patient at the bedside on Wednesday, 10/24 (approx. 8 pm) regarding dialysis transportation. Ms. Brittany Brock is in [place] visiting with her sisters from [place], [place]. She reported that the address the hospital has is her sister's address. Patient reported that [facility] was in [facility] 2018 and did not have any problems with getting her transport as her transport is arranged by [employer]. Ms. Brittany Brock reported that she has been to [address] in [place] and [address] in [place], [place] for dialysis on previous visits. Patient indicated that her sisters don't have cars. Patient provided with a [employer] application and Ms. Shankar indicated that she has [place] Medicaid. CSW will continue to follow and provided intervention services to assist patient with her transportation needs.  Visited with patient this afternoon regarding her dialysis transportation and providing bus passes to get patient through October. Ms. Brittany Brock has been set-up at [facility], starting Friday, 10/26 at 12:00. Patient declined bus passes, indicating that she uses a walker and cannot stand for long periods of time. Ms. Brittany Brock was provided with a [employer] application on 10/24 and had begun to work on it. CSW talked with patient about items she had not completed on application, and she was provided with a pen to complete form. CSW also contacted [employer] and was advised that patient can get transportation from them, even though she is not a resident of [place]. CSW will f/u with patient on Friday to get application and mail to [employer] office.     Genelle BalVanessa Antwyne Pingree, MSW, LCSW Licensed Clinical Social Worker Clinical Social Work Department [facility] [phone]

## 2017-07-26 DIAGNOSIS — Z992 Dependence on renal dialysis: Secondary | ICD-10-CM

## 2017-07-26 MED ORDER — WHITE PETROLATUM EX OINT
TOPICAL_OINTMENT | CUTANEOUS | Status: AC
  Filled 2017-07-26: qty 28.35

## 2017-07-26 NOTE — Procedures (Signed)
I was present at this session.  I have reviewed the session itself and made appropriate changes.  Hd via RUA avf. bp ^ to start.   Brittany Brock L 10/26/20189:15 AM

## 2017-07-26 NOTE — Clinical Social Work Note (Addendum)
[employer] application completed by patient and faxed to [employer] office. Called [employer] office and talked with Annice PihJackie who reviewed patient's application and advised CSW that Ms. Putman approved for [employer] services effective today and she can begin calling to arrange transport Saturday, 10/27. The reservation line's number is [phone] and the fare is $1.50 per trip.Her certification is good through the end of November and an in-person interview will be scheduled with patient for the  Recertification. Patient advised and informed that MD will be notified. Ms. Francee PiccoloMcCloud will be given [employer] application and typed  instructions on what she needs to do.  Genelle BalVanessa Eimi Viney, MSW, LCSW Licensed Clinical Social Worker Clinical Social Work Department [facility] [phone]

## 2017-07-26 NOTE — Progress Notes (Signed)
PROGRESS NOTE    Brittany Brock  ZOX:[id number] DOB: 1966/06/22 DOA: 07/23/2017 PCP: Patient, No Pcp Per   Brief Narrative: 51 year old female with history of ESRD on hemodialysis, hypertension who is visiting from [place] presented to the hospital for hemodialysis treatment. She is here visiting her sister and had arrangement for  Dialysis in [place]. She missed her treatment because of transportation issue.  Patient received hemodialysis in the hospital with significant clinical improvement. She was discharged on 10/24, however she was unable to leave hospital because of problem with arranging transportation and hemodialysis. She remains clinically stable.  Assessment & Plan:  # Acute respiratory failure with hypoxia on admission due to acute pulmonary edema in the setting of noncompliance with hemodialysis: Hypoxia and shortness of breath improved after receiving dialysis. Social worker and case Lexicographer ongoing for safe discharge planning. Continue current medical and supportive treatment while patient is in the hospital.  Hypertensive urgency on admission: Continue current antihypertensive medication and dialysis treatment. Her blood pressure acceptable.  ESRD on hemodialysis: Tolerating dialysis well. Nephrology consult appreciated.   Morbid obesity: Education provided to the patient  Anemia of chronic kidney disease: IV iron and ESA during dialysis per nephrology.  DVT prophylaxis: Heparin subcutaneous Code Status: Full code Family Communication: No family at bedside Disposition Plan: Unknown at this time. Patient is medically stable waiting for social worker and care management team final plan.    Consultants:   Nephrology  Procedures: Dialysis Antimicrobials: None  Subjective: Seen and examined at dialysis. Patient was watching TV and not willing to talk much. She looked comfortable and denies any pain or shortness of breath.  Objective: Vitals:   07/26/17 0500 07/26/17 0739 07/26/17 0930 07/26/17 1000  BP: (!) 139/99  (!) 159/94 102/64  Pulse: (!) 59  (!) 53 63  Resp: 18     Temp: 98.2 F (36.8 C)     TempSrc: Oral     SpO2: 99% 96%    Weight:      Height:        Intake/Output Summary (Last 24 hours) at 07/26/17 1155 Last data filed at 07/26/17 0900  Gross per 24 hour  Intake             1135 ml  Output                0 ml  Net             1135 ml   Filed Weights   07/24/17 1400 07/24/17 1804 07/24/17 2141  Weight: 131 kg (288 lb 12.8 oz) 128 kg (282 lb 3 oz) 128.5 kg (283 lb 3.2 oz)    Examination:  General exam: Appears calm and comfortable  Respiratory system: Clear to auscultation. Respiratory effort normal. No wheezing or crackle Cardiovascular system: S1 & S2 heard, RRR.  No pedal edema. Gastrointestinal system: Abdomen is nondistended, soft and nontender. Normal bowel sounds heard. Central nervous system: Alert and oriented. No focal neurological deficits. Skin: No rashes, lesions or ulcers     Data Reviewed: I have personally reviewed following labs and imaging studies  CBC:  Recent Labs Lab 07/23/17 0602 07/23/17 2237 07/24/17 1400  WBC  --  9.9 7.3  HGB 9.5* 8.2* 8.4*  HCT 28.0* 26.4* 27.5*  MCV  --  85.2 86.2  PLT  --  198 200   Basic Metabolic Panel:  Recent Labs Lab 07/23/17 0602 07/23/17 2100 07/23/17 2237 07/24/17 1400  NA 138 136  --  136  K 4.9 4.6  --  4.1  CL 99* 96*  --  96*  CO2  --  27  --  29  GLUCOSE 95 108*  --  126*  BUN 45* 50*  --  23*  CREATININE 12.50* 13.52* 13.75* 8.04*  CALCIUM  --  9.2  --  9.4  PHOS  --  7.5*  --  5.7*   GFR: Estimated Creatinine Clearance: 11.1 mL/min (A) (by C-G formula based on SCr of 8.04 mg/dL (H)). Liver Function Tests:  Recent Labs Lab 07/23/17 2100 07/24/17 1400  ALBUMIN 3.3* 3.3*   No results for input(s): LIPASE, AMYLASE in the last 168 hours. No results for input(s): AMMONIA in the last 168 hours. Coagulation  Profile: No results for input(s): INR, PROTIME in the last 168 hours. Cardiac Enzymes: No results for input(s): CKTOTAL, CKMB, CKMBINDEX, TROPONINI in the last 168 hours. BNP (last 3 results) No results for input(s): PROBNP in the last 8760 hours. HbA1C: No results for input(s): HGBA1C in the last 72 hours. CBG: No results for input(s): GLUCAP in the last 168 hours. Lipid Profile: No results for input(s): CHOL, HDL, LDLCALC, TRIG, CHOLHDL, LDLDIRECT in the last 72 hours. Thyroid Function Tests: No results for input(s): TSH, T4TOTAL, FREET4, T3FREE, THYROIDAB in the last 72 hours. Anemia Panel: No results for input(s): VITAMINB12, FOLATE, FERRITIN, TIBC, IRON, RETICCTPCT in the last 72 hours. Sepsis Labs: No results for input(s): PROCALCITON, LATICACIDVEN in the last 168 hours.  Recent Results (from the past 240 hour(s))  MRSA PCR Screening     Status: None   Collection Time: 07/23/17  8:28 PM  Result Value Ref Range Status   MRSA by PCR NEGATIVE NEGATIVE Final    Comment:        The GeneXpert MRSA Assay (FDA approved for NASAL specimens only), is one component of a comprehensive MRSA colonization surveillance program. It is not intended to diagnose MRSA infection nor to guide or monitor treatment for MRSA infections.          Radiology Studies: No results found.      Scheduled Meds: . amLODipine  10 mg Oral QHS  . calcitRIOL  3.5 mcg Oral QODAY  . calcium acetate  2,668 mg Oral TID WC  . heparin  5,000 Units Subcutaneous Q8H  . isosorbide mononitrate  30 mg Oral Daily  . losartan  100 mg Oral QHS  . metoprolol succinate  100 mg Oral QHS  . mometasone-formoterol  2 puff Inhalation BID  . multivitamin  1 tablet Oral QHS  . sodium chloride flush  3 mL Intravenous Q12H   Continuous Infusions: . sodium chloride       LOS: 1 day    [name] Jaynie CollinsPrasad Bhandari, MD [facility] Pager [phone]  If 7PM-7AM, please contact  night-coverage [email or web address] Password [email or web address] 07/26/2017, 11:55 AM

## 2017-07-26 NOTE — Progress Notes (Signed)
Patient Discharge: Disposition: Patient discharged to home. Education: Reviewed medications, prescriptions, follow-up appointment and discharge instructions, verbalized understanding. Telemetry: N/A Transportation: Patient escorted out of the unit in w/c till the ride. Belongings: patient took all her belongings with her.

## 2017-10-27 IMAGING — CR DG CHEST 2V
2 series · 2 of 2 positions shown · non-contrast
Comparison: 10/06/2016

CLINICAL DATA: Cough and shortness of breath. Left-sided chest
pain.

EXAM:
CHEST  2 VIEW

[chest lat]
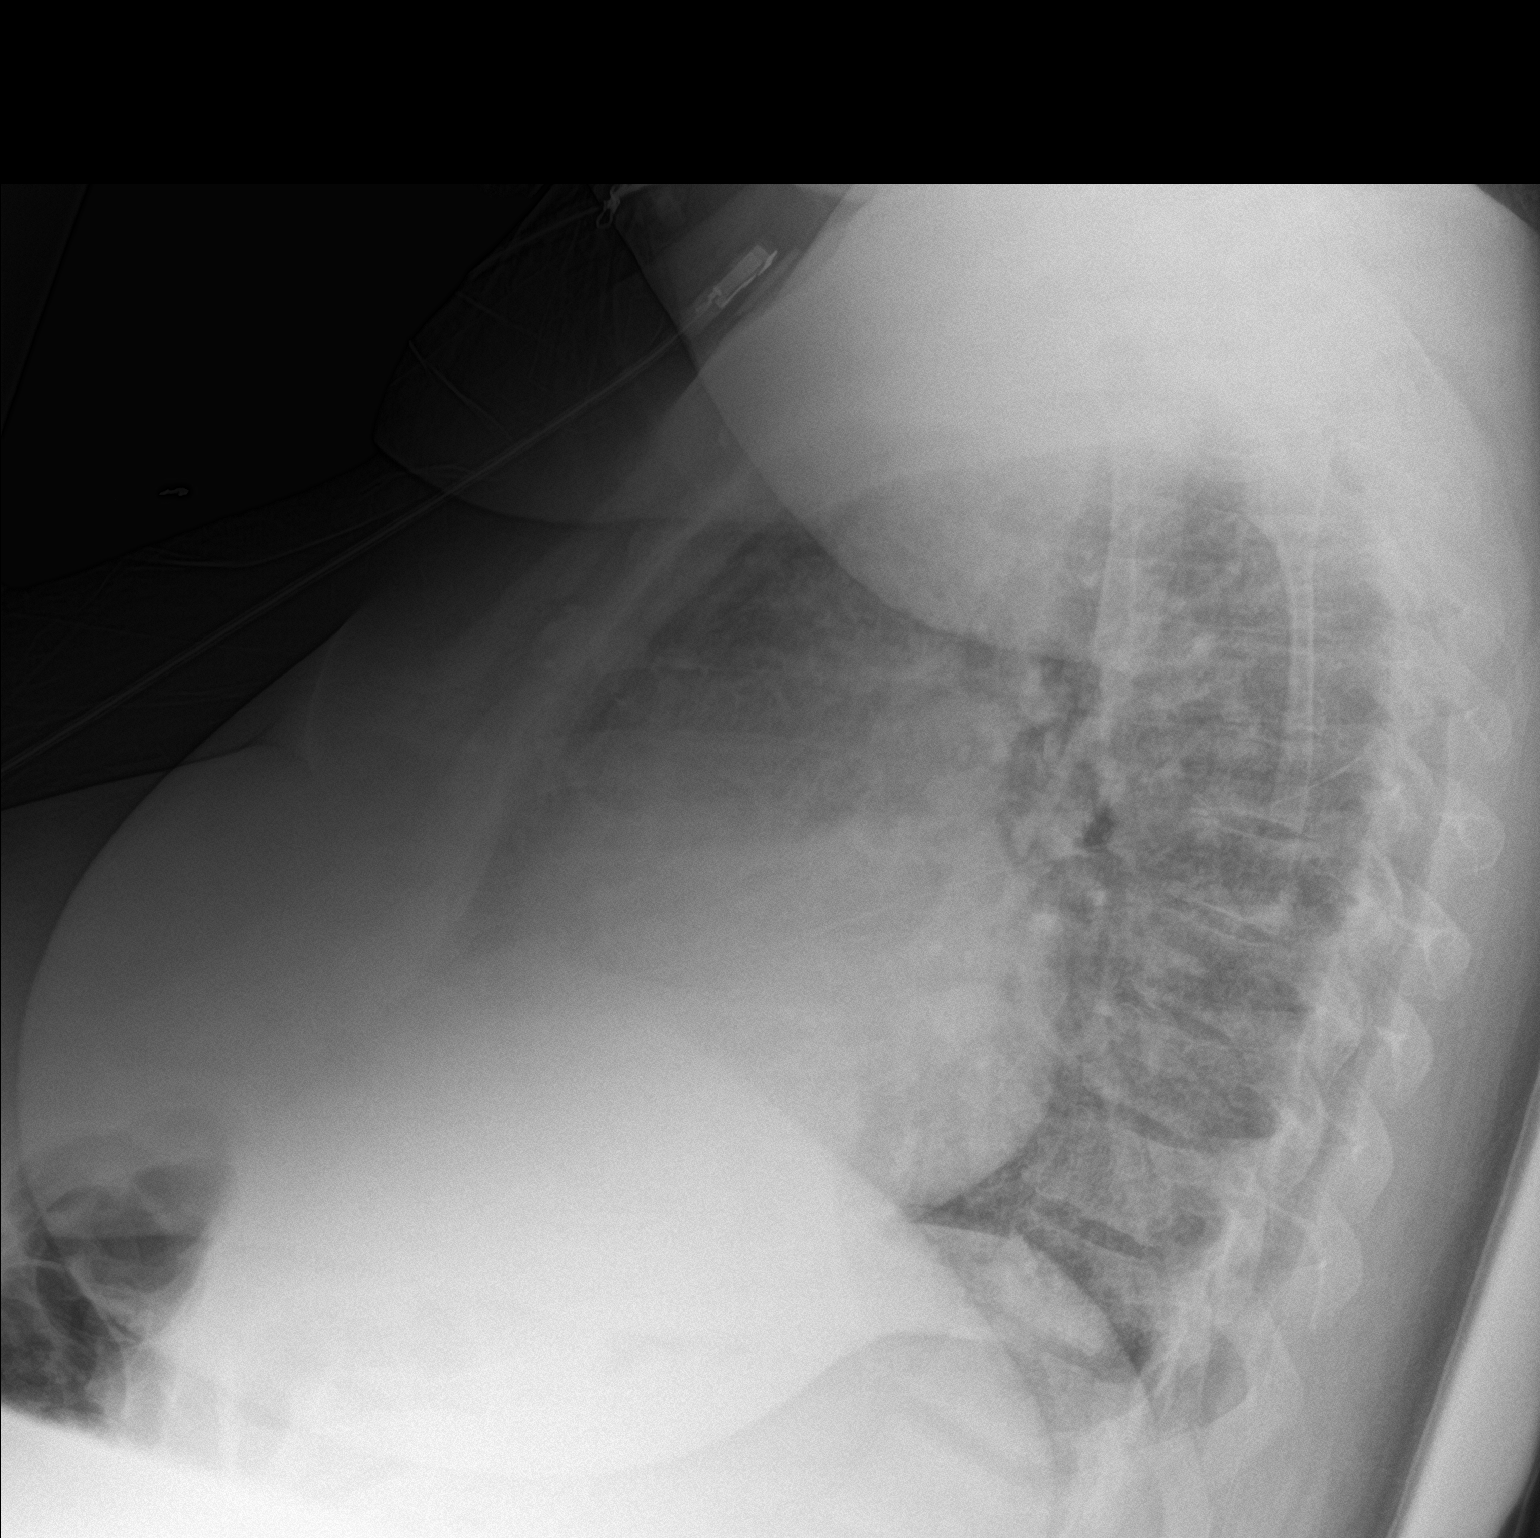

[chest ap]
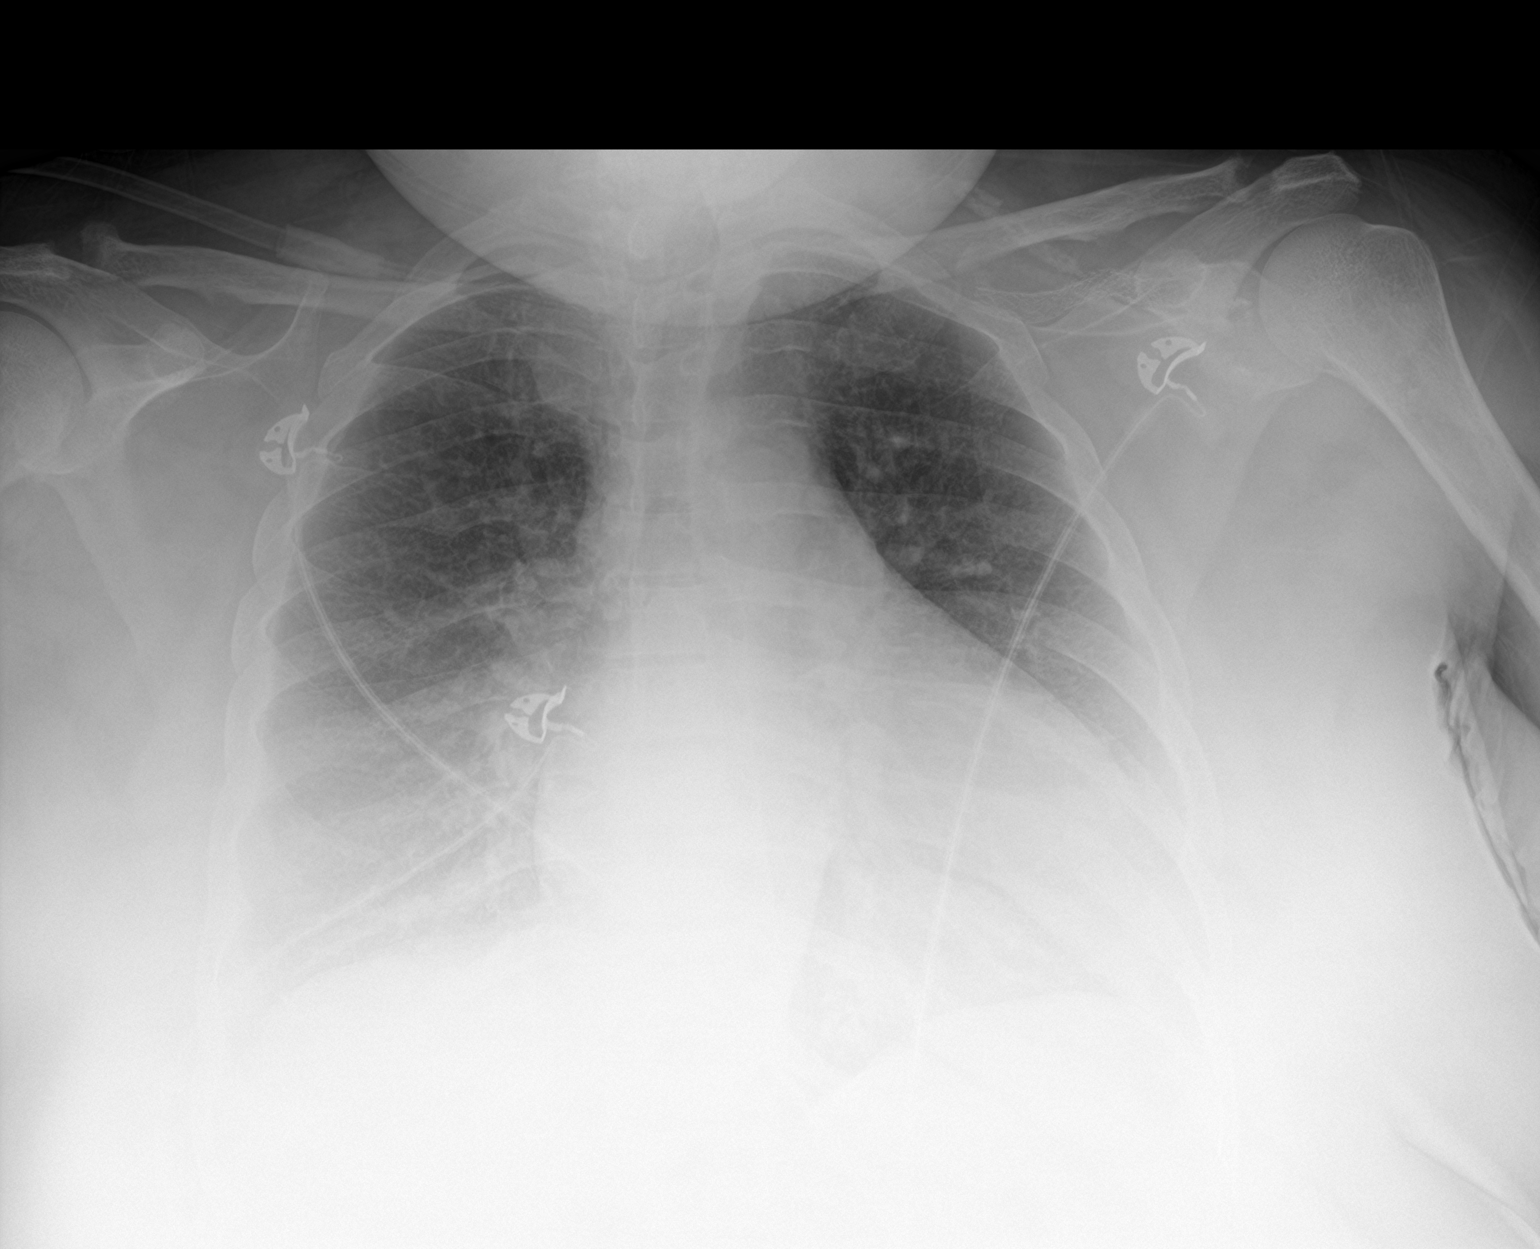

[2 of 2 positions shown; findings below may reference images not displayed]

FINDINGS: The heart is enlarged. There is vascular congestion. Mild pulmonary
edema is new from prior. Mild bibasilar atelectasis. No confluent
airspace disease. Stable osseous structures from prior with
resorption of the distal clavicles and sclerotic change.
IMPRESSION: Cardiomegaly with vascular congestion and mild pulmonary edema,
consistent with CHF.

## 2018-08-01 IMAGING — DX DG CHEST 2V
2 series · 2 of 2 positions shown · non-contrast
Comparison: 10/24/2016

CLINICAL DATA: Shortness of breath

EXAM:
CHEST  2 VIEW

[chest lat]
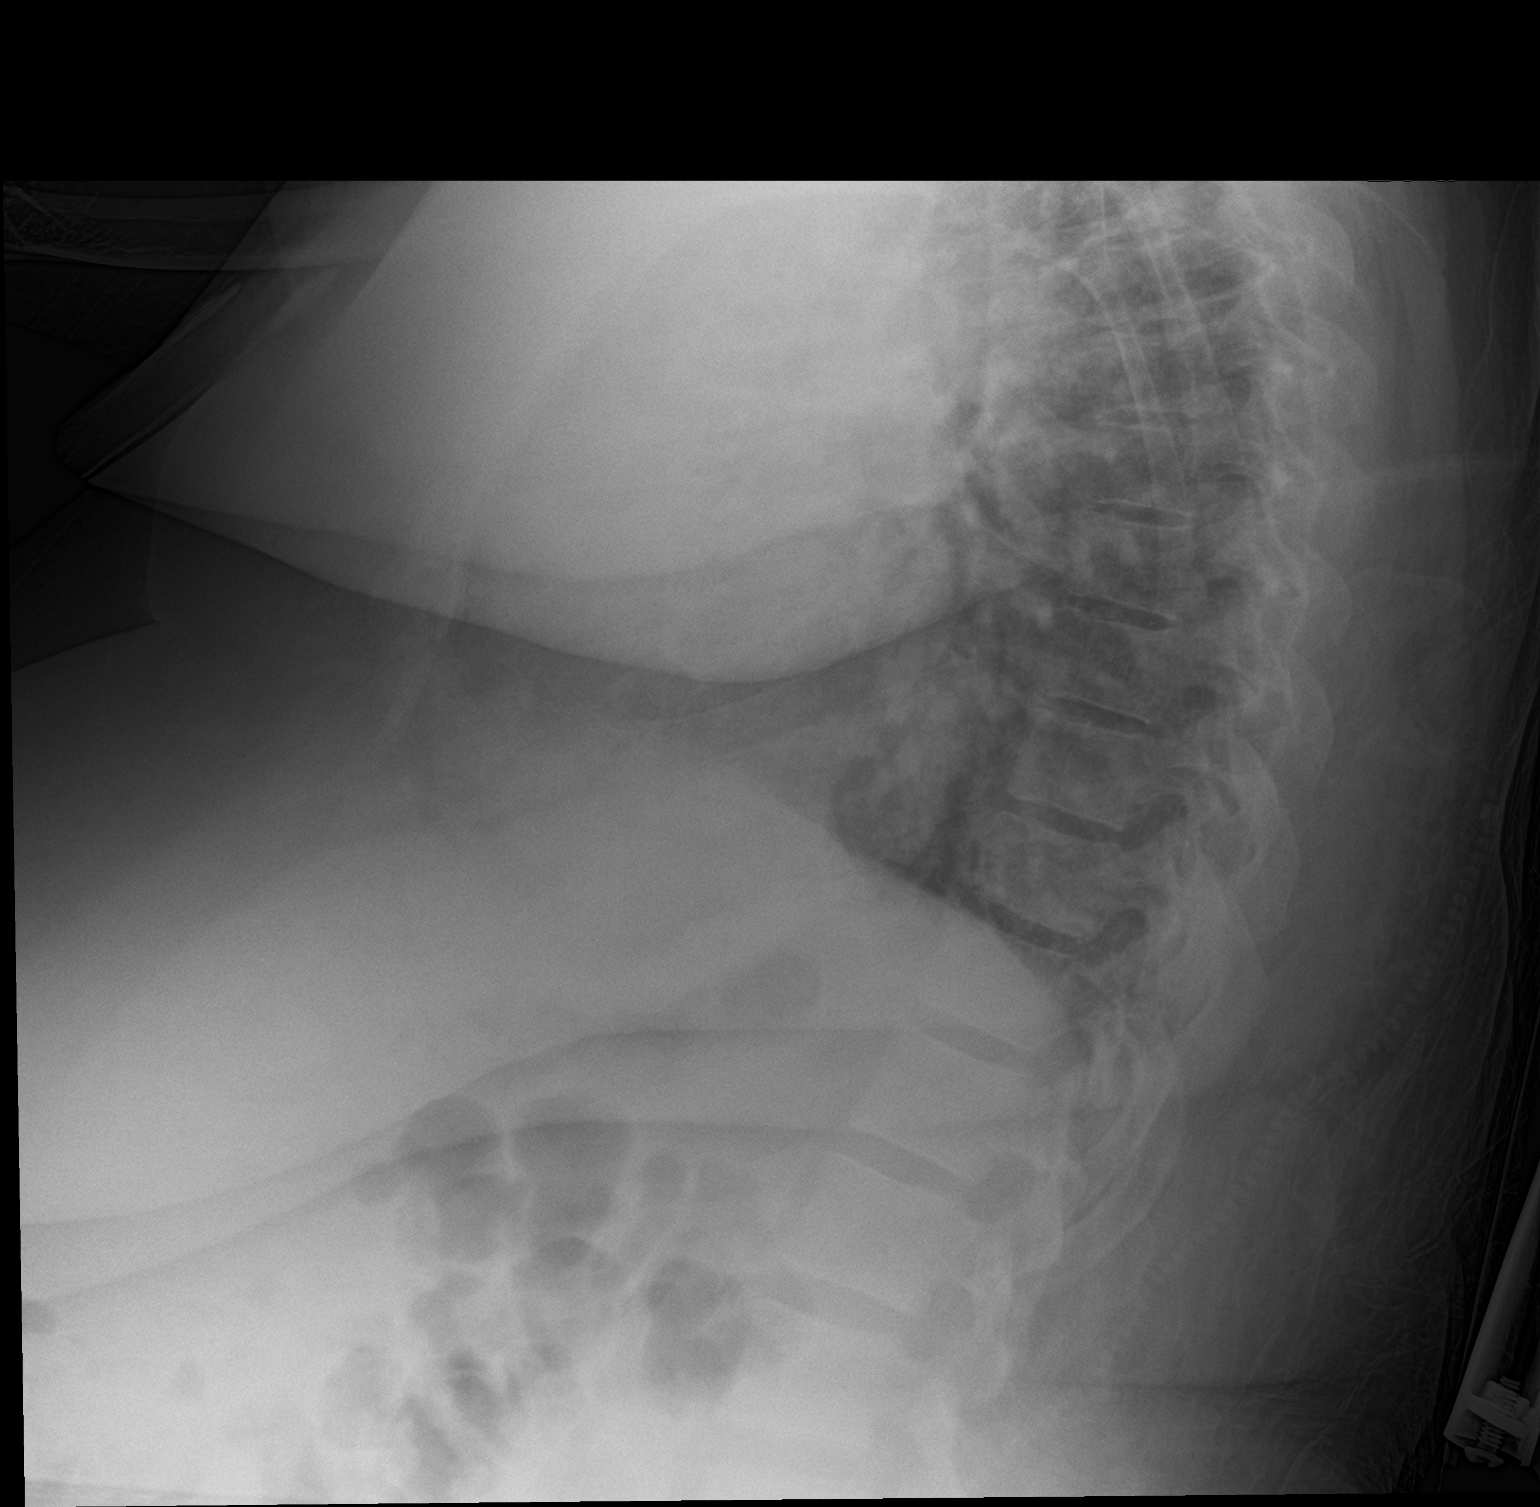

[chest ap]
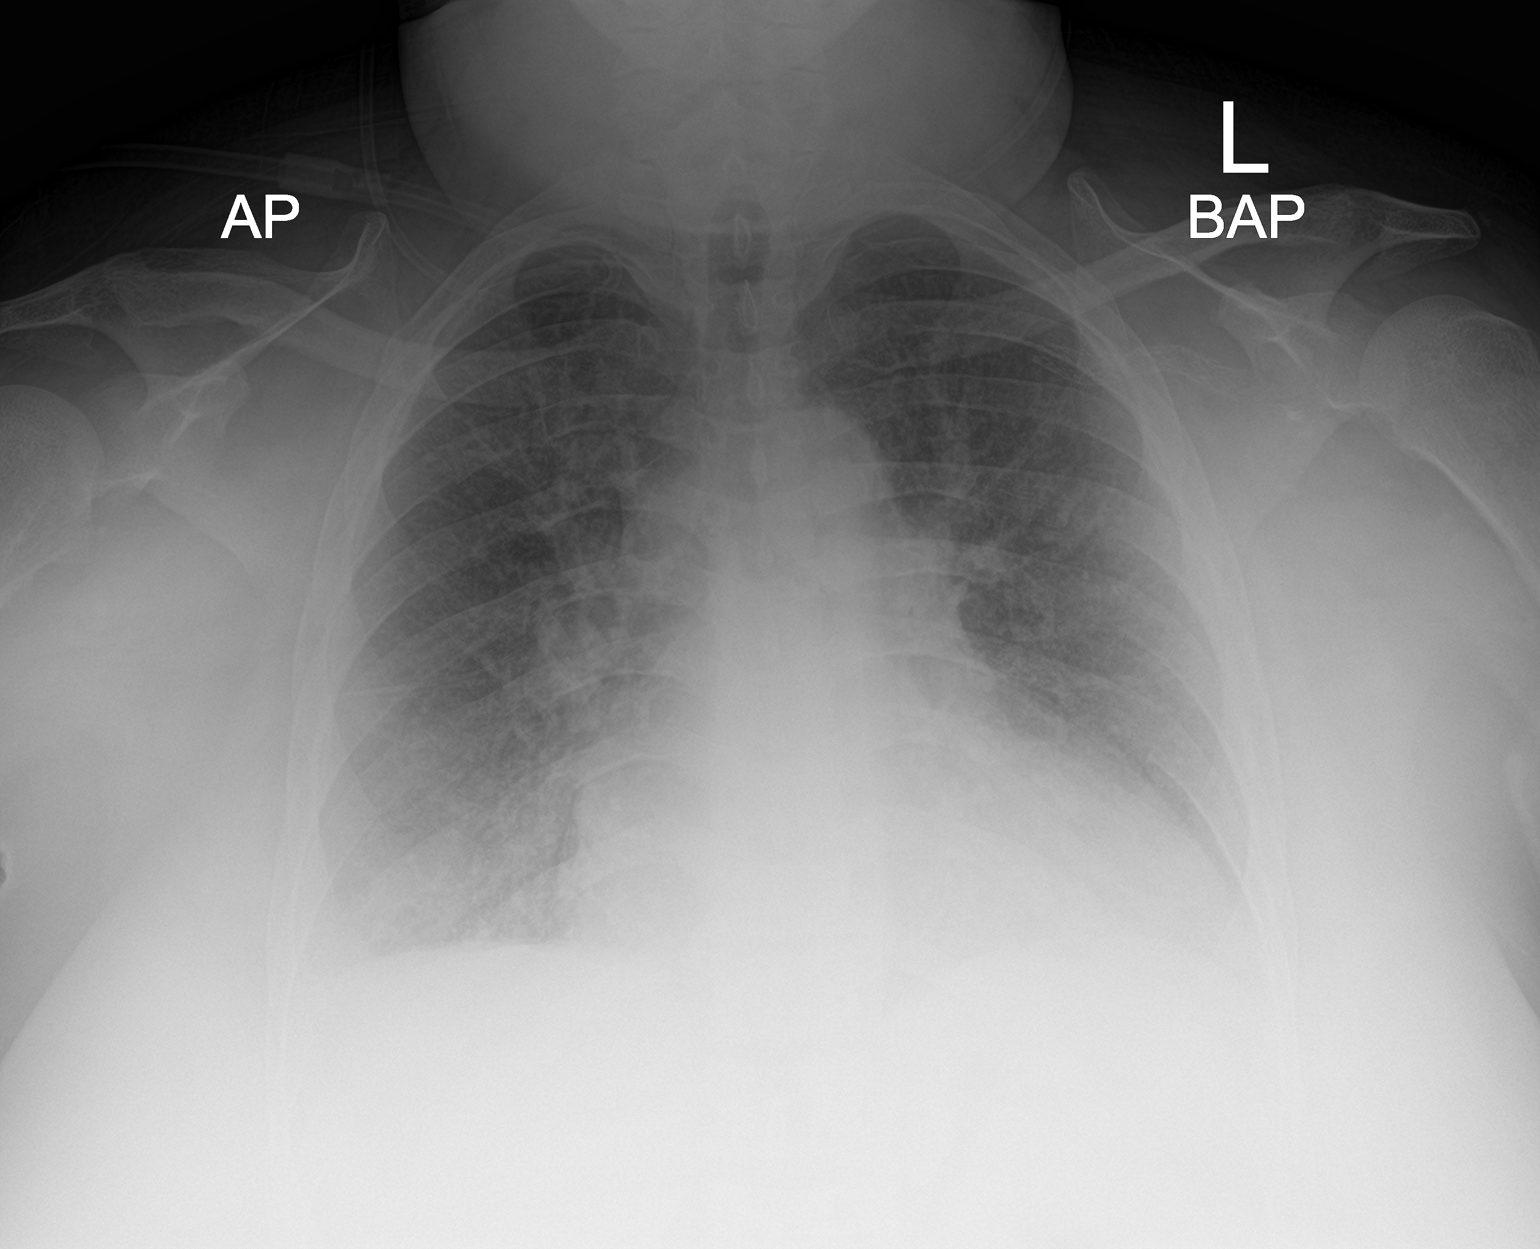

[2 of 2 positions shown; findings below may reference images not displayed]

FINDINGS: Cardiomegaly with central vascular congestion and mild diffuse
interstitial opacities suggesting mild pulmonary edema. No large
effusion. No focal consolidation. No pneumothorax.
IMPRESSION: Cardiomegaly with central vascular congestion and mild diffuse
interstitial edema
